# Patient Record
Sex: Male | Born: 1995 | Race: Black or African American | Hispanic: No | Marital: Single | Smoking: Current every day smoker
Health system: Southern US, Community
[De-identification: ages and names within clinical notes are randomized; demographics above are authoritative.]

## PROBLEM LIST (undated history)

## (undated) DIAGNOSIS — F319 Bipolar disorder, unspecified: Secondary | ICD-10-CM

## (undated) DIAGNOSIS — F329 Major depressive disorder, single episode, unspecified: Secondary | ICD-10-CM

## (undated) DIAGNOSIS — F909 Attention-deficit hyperactivity disorder, unspecified type: Secondary | ICD-10-CM

## (undated) DIAGNOSIS — F32A Depression, unspecified: Secondary | ICD-10-CM

## (undated) DIAGNOSIS — F4310 Post-traumatic stress disorder, unspecified: Secondary | ICD-10-CM

## (undated) DIAGNOSIS — D573 Sickle-cell trait: Secondary | ICD-10-CM

## (undated) DIAGNOSIS — G43909 Migraine, unspecified, not intractable, without status migrainosus: Secondary | ICD-10-CM

## (undated) HISTORY — PX: WISDOM TOOTH EXTRACTION: SHX21

## (undated) HISTORY — PX: KNEE SURGERY: SHX244

---
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
## 2014-05-19 ENCOUNTER — Encounter (HOSPITAL_COMMUNITY): Payer: Self-pay | Admitting: *Deleted

## 2014-05-19 ENCOUNTER — Emergency Department (HOSPITAL_COMMUNITY)
Admission: EM | Admit: 2014-05-19 | Discharge: 2014-05-20 | Disposition: A | Discharge status: Home / Self Care | Payer: Medicaid Other | Attending: Emergency Medicine | Admitting: Emergency Medicine

## 2014-05-19 DIAGNOSIS — F32A Depression, unspecified: Secondary | ICD-10-CM

## 2014-05-19 DIAGNOSIS — R51 Headache: Secondary | ICD-10-CM | POA: Insufficient documentation

## 2014-05-19 DIAGNOSIS — Z8679 Personal history of other diseases of the circulatory system: Secondary | ICD-10-CM | POA: Diagnosis not present

## 2014-05-19 DIAGNOSIS — F329 Major depressive disorder, single episode, unspecified: Secondary | ICD-10-CM | POA: Insufficient documentation

## 2014-05-19 DIAGNOSIS — Z79899 Other long term (current) drug therapy: Secondary | ICD-10-CM | POA: Diagnosis not present

## 2014-05-19 DIAGNOSIS — R519 Headache, unspecified: Secondary | ICD-10-CM

## 2014-05-19 HISTORY — DX: Migraine, unspecified, not intractable, without status migrainosus: G43.909

## 2014-05-19 HISTORY — DX: Depression, unspecified: F32.A

## 2014-05-19 HISTORY — DX: Major depressive disorder, single episode, unspecified: F32.9

## 2014-05-19 NOTE — ED Notes (Addendum)
Pt came in via EMS.  Pt reports migraine h/a all day today and has been vomiting.  Pt also requesting to speak to someone re his depression.  Denies SI

## 2014-05-20 ENCOUNTER — Encounter (HOSPITAL_COMMUNITY): Payer: Self-pay | Admitting: *Deleted

## 2014-05-20 LAB — COMPREHENSIVE METABOLIC PANEL
ALBUMIN: 4.1 g/dL (ref 3.5–5.2)
ALK PHOS: 67 U/L (ref 39–117)
ALT: 7 U/L (ref 0–53)
ANION GAP: 11 (ref 5–15)
AST: 15 U/L (ref 0–37)
BUN: 13 mg/dL (ref 6–23)
CO2: 25 mEq/L (ref 19–32)
Calcium: 9.7 mg/dL (ref 8.4–10.5)
Chloride: 102 mEq/L (ref 96–112)
Creatinine, Ser: 0.88 mg/dL (ref 0.50–1.35)
Glucose, Bld: 105 mg/dL — ABNORMAL HIGH (ref 70–99)
POTASSIUM: 4.1 meq/L (ref 3.7–5.3)
Sodium: 138 mEq/L (ref 137–147)
TOTAL PROTEIN: 7 g/dL (ref 6.0–8.3)
Total Bilirubin: 0.6 mg/dL (ref 0.3–1.2)

## 2014-05-20 LAB — CBC
HEMATOCRIT: 41.3 % (ref 39.0–52.0)
HEMOGLOBIN: 14.1 g/dL (ref 13.0–17.0)
MCH: 27.8 pg (ref 26.0–34.0)
MCHC: 34.1 g/dL (ref 30.0–36.0)
MCV: 81.5 fL (ref 78.0–100.0)
Platelets: 286 10*3/uL (ref 150–400)
RBC: 5.07 MIL/uL (ref 4.22–5.81)
RDW: 13.2 % (ref 11.5–15.5)
WBC: 6.4 10*3/uL (ref 4.0–10.5)

## 2014-05-20 LAB — RAPID URINE DRUG SCREEN, HOSP PERFORMED
Amphetamines: NOT DETECTED
BARBITURATES: NOT DETECTED
Benzodiazepines: NOT DETECTED
Cocaine: NOT DETECTED
Opiates: NOT DETECTED
Tetrahydrocannabinol: NOT DETECTED

## 2014-05-20 LAB — ACETAMINOPHEN LEVEL: Acetaminophen (Tylenol), Serum: <15 ug/mL (ref 10–30)

## 2014-05-20 LAB — ETHANOL

## 2014-05-20 LAB — SALICYLATE LEVEL: Salicylate Lvl: <2 mg/dL — ABNORMAL LOW (ref 2.8–20.0)

## 2014-05-20 MED ORDER — ACETAMINOPHEN 325 MG PO TABS: 650.0000 mg | ORAL_TABLET | ORAL | Status: DC | PRN

## 2014-05-20 MED ORDER — ACETAMINOPHEN 325 MG PO TABS
650.0000 mg | ORAL_TABLET | Freq: Once | ORAL | Status: AC
  Administered 2014-05-20: 650 mg via ORAL
  Filled 2014-05-20: qty 2

## 2014-05-20 MED ORDER — ONDANSETRON HCL 4 MG PO TABS: 4.0000 mg | ORAL_TABLET | Freq: Three times a day (TID) | ORAL | Status: DC | PRN

## 2014-05-20 NOTE — ED Provider Notes (Signed)
CSN: [id number]     Arrival date & time 05/19/14  2240 History   First MD Initiated Contact with Patient 05/19/14 2343     Chief Complaint  Patient presents with  . Migraine  . Depression     (Consider location/radiation/quality/duration/timing/severity/associated sxs/prior Treatment) Patient is a 18 y.o. male presenting with migraines and mental health disorder. The history is provided by the patient. No language interpreter was used.  Migraine This is a new problem. The current episode started today. Associated symptoms include headaches and nausea. Pertinent negatives include no chills or fever. Associated symptoms comments: Frontal headache that started gradually after an argument with his mother. No fever, visual changes, weakness. He endorses nausea. .  Mental Health Problem Presenting symptoms: depression   Presenting symptoms: no hallucinations and no suicidal thoughts   Associated symptoms: headaches   Associated symptoms comment:  He reports feeling depressed without SI/HI. He reports history of extended hospitalization in the past for mental health issues and for issues surrounding relationship with his brother.    Past Medical History  Diagnosis Date  . Migraine    Past Surgical History  Procedure Laterality Date  . Wisdom tooth extraction     No family history on file. History  Substance Use Topics  . Smoking status: Never Smoker   . Smokeless tobacco: Not on file  . Alcohol Use: No    Review of Systems  Constitutional: Negative for fever and chills.  Respiratory: Negative.   Cardiovascular: Negative.   Gastrointestinal: Positive for nausea.  Musculoskeletal: Negative.   Skin: Negative.   Neurological: Positive for headaches.  Psychiatric/Behavioral: Negative for suicidal ideas and hallucinations.      Allergies  Review of patient's allergies indicates no known allergies.  Home Medications   Prior to Admission medications   Medication Sig Start  Date End Date Taking? Authorizing Provider  methylphenidate 36 MG PO CR tablet Take 36 mg by mouth daily.   Yes Historical Provider, MD   There were no vitals taken for this visit. Physical Exam  Constitutional: He is oriented to person, place, and time. He appears well-developed and well-nourished.  HENT:  Head: Normocephalic and atraumatic.  Eyes: EOM are normal. Pupils are equal, round, and reactive to light.  Neck: Normal range of motion.  Cardiovascular: Normal rate and regular rhythm.   No murmur heard. Pulmonary/Chest: Effort normal and breath sounds normal. He has no wheezes. He has no rales.  Abdominal: Soft. There is no tenderness.  Musculoskeletal: Normal range of motion.  Neurological: He is alert and oriented to person, place, and time. He has normal strength and normal reflexes. No sensory deficit. He displays a negative Romberg sign.  Skin: Skin is warm and dry.  Psychiatric: His speech is normal and behavior is normal. Judgment and thought content normal. Cognition and memory are normal. He does not exhibit a depressed mood.    ED Course  Procedures (including critical care time) Labs Review Labs Reviewed  CBC  ACETAMINOPHEN LEVEL  COMPREHENSIVE METABOLIC PANEL  ETHANOL  SALICYLATE LEVEL  URINE RAPID DRUG SCREEN (HOSP PERFORMED)    Imaging Review No results found.   EKG Interpretation None      MDM   Final diagnoses:  None    1. Depression 2. Nonspecific headache  The patient appears stable. He arrives from homeless shelter where, per [facility], he was asked to leave because he missed curfew. [employer] was called to remove patient from the premises.   He is not SI/HI. He  does not appear to be hallucinating, denies substance abuse issues. He does have a hospitalization in the past for which the diagnoses are unclear. Will have TTS consultation to insure safety in discharge and to provide resources.     Arnoldo HookerShari A Pacey Willadsen, PA-C 05/20/14 [id number]  Tomasita CrumbleAdeleke Oni,  MD 05/20/14 [phone]

## 2014-05-20 NOTE — BH Assessment (Signed)
Spoke with Elpidio AnisShari Upstill, PA-C prior to assessing pt. Reports pt is recently homeless and requesting assistance with his depression. Pt denies HI/SI and most likely will be able to be discharged with OP resources if assessment  Reveals the same.    Assessment to commence shortly.   Clista BernhardtNancy Carizma Dunsworth, [facility] Triage Specialist 05/20/2014 1:39 AM
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
## 2014-05-20 NOTE — BH Assessment (Signed)
Relayed results of assessment to Donell SievertSpencer Simon, PA, per [facility] pt does not meet inpt criteria and can be discharged with OP resources.   Informed Elpidio AnisShari Upstill PA-C of recommendations and she is in agreement.   Informed Pt and RN of plan.    Clista BernhardtNancy Joanne Brander, [facility] Triage Specialist 05/20/2014 2:03 AM

## 2014-05-20 NOTE — Discharge Instructions (Signed)
USE THE RESOURCES GIVEN FOR OUTPATIENT FOLLOW UP AND FURTHER MANAGEMENT.

## 2015-03-19 ENCOUNTER — Encounter (HOSPITAL_COMMUNITY): Payer: Self-pay | Admitting: Emergency Medicine

## 2015-03-19 ENCOUNTER — Emergency Department (HOSPITAL_COMMUNITY)
Admission: EM | Admit: 2015-03-19 | Discharge: 2015-03-19 | Disposition: A | Discharge status: Home / Self Care | Payer: Medicaid Other | Attending: Emergency Medicine | Admitting: Emergency Medicine

## 2015-03-19 DIAGNOSIS — Y92241 Library as the place of occurrence of the external cause: Secondary | ICD-10-CM | POA: Insufficient documentation

## 2015-03-19 DIAGNOSIS — Y9389 Activity, other specified: Secondary | ICD-10-CM | POA: Diagnosis not present

## 2015-03-19 DIAGNOSIS — F329 Major depressive disorder, single episode, unspecified: Secondary | ICD-10-CM | POA: Diagnosis not present

## 2015-03-19 DIAGNOSIS — F909 Attention-deficit hyperactivity disorder, unspecified type: Secondary | ICD-10-CM | POA: Diagnosis not present

## 2015-03-19 DIAGNOSIS — S40012A Contusion of left shoulder, initial encounter: Secondary | ICD-10-CM | POA: Diagnosis not present

## 2015-03-19 DIAGNOSIS — Y998 Other external cause status: Secondary | ICD-10-CM | POA: Diagnosis not present

## 2015-03-19 DIAGNOSIS — Z79899 Other long term (current) drug therapy: Secondary | ICD-10-CM | POA: Diagnosis not present

## 2015-03-19 DIAGNOSIS — S4992XA Unspecified injury of left shoulder and upper arm, initial encounter: Secondary | ICD-10-CM | POA: Diagnosis present

## 2015-03-19 HISTORY — DX: Attention-deficit hyperactivity disorder, unspecified type: F90.9

## 2015-03-19 MED ORDER — NAPROXEN 500 MG PO TABS
500.0000 mg | ORAL_TABLET | Freq: Once | ORAL | Status: AC
  Administered 2015-03-19: 500 mg via ORAL
  Filled 2015-03-19: qty 1

## 2015-03-19 MED ORDER — NAPROXEN 500 MG PO TABS: 500.0000 mg | ORAL_TABLET | Freq: Two times a day (BID) | ORAL | Status: DC
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
## 2015-03-19 NOTE — Discharge Instructions (Signed)
Contusion °A contusion is a deep bruise. Contusions are the result of an injury that caused bleeding under the skin. The contusion may turn blue, purple, or yellow. Minor injuries will give you a painless contusion, but more severe contusions may stay painful and swollen for a few weeks.  °CAUSES  °A contusion is usually caused by a blow, trauma, or direct force to an area of the body. °SYMPTOMS  °· Swelling and redness of the injured area. °· Bruising of the injured area. °· Tenderness and soreness of the injured area. °· Pain. °DIAGNOSIS  °The diagnosis can be made by taking a history and physical exam. An X-ray, CT scan, or MRI may be needed to determine if there were any associated injuries, such as fractures. °TREATMENT  °Specific treatment will depend on what area of the body was injured. In general, the best treatment for a contusion is resting, icing, elevating, and applying cold compresses to the injured area. Over-the-counter medicines may also be recommended for pain control. Ask your caregiver what the best treatment is for your contusion. °HOME CARE INSTRUCTIONS  °· Put ice on the injured area. °¨ Put ice in a plastic bag. °¨ Place a towel between your skin and the bag. °¨ Leave the ice on for 15-20 minutes, 3-4 times a day, or as directed by your health care provider. °· Only take over-the-counter or prescription medicines for pain, discomfort, or fever as directed by your caregiver. Your caregiver may recommend avoiding anti-inflammatory medicines (aspirin, ibuprofen, and naproxen) for 48 hours because these medicines may increase bruising. °· Rest the injured area. °· If possible, elevate the injured area to reduce swelling. °SEEK IMMEDIATE MEDICAL CARE IF:  °· You have increased bruising or swelling. °· You have pain that is getting worse. °· Your swelling or pain is not relieved with medicines. °MAKE SURE YOU:  °· Understand these instructions. °· Will watch your condition. °· Will get help right  away if you are not doing well or get worse. °Document Released: 03/23/2005 Document Revised: 06/18/2013 Document Reviewed: 04/18/2011 °[employer]® Patient Information ©2015 [employer]. This information is not intended to replace advice given to you by your health care provider. Make sure you discuss any questions you have with your health care provider. ° °

## 2015-04-30 ENCOUNTER — Encounter (HOSPITAL_COMMUNITY): Payer: Self-pay | Admitting: *Deleted

## 2015-04-30 ENCOUNTER — Emergency Department (HOSPITAL_COMMUNITY): Payer: Medicaid Other

## 2015-04-30 ENCOUNTER — Emergency Department (HOSPITAL_COMMUNITY)
Admission: EM | Admit: 2015-04-30 | Discharge: 2015-05-01 | Disposition: A | Discharge status: Home / Self Care | Payer: Medicaid Other | Attending: Emergency Medicine | Admitting: Emergency Medicine

## 2015-04-30 DIAGNOSIS — Z79899 Other long term (current) drug therapy: Secondary | ICD-10-CM | POA: Diagnosis not present

## 2015-04-30 DIAGNOSIS — Z8679 Personal history of other diseases of the circulatory system: Secondary | ICD-10-CM | POA: Diagnosis not present

## 2015-04-30 DIAGNOSIS — F909 Attention-deficit hyperactivity disorder, unspecified type: Secondary | ICD-10-CM | POA: Insufficient documentation

## 2015-04-30 DIAGNOSIS — R109 Unspecified abdominal pain: Secondary | ICD-10-CM | POA: Insufficient documentation

## 2015-04-30 DIAGNOSIS — R197 Diarrhea, unspecified: Secondary | ICD-10-CM | POA: Insufficient documentation

## 2015-04-30 DIAGNOSIS — R112 Nausea with vomiting, unspecified: Secondary | ICD-10-CM | POA: Insufficient documentation

## 2015-04-30 DIAGNOSIS — R079 Chest pain, unspecified: Secondary | ICD-10-CM | POA: Insufficient documentation

## 2015-04-30 DIAGNOSIS — R1110 Vomiting, unspecified: Secondary | ICD-10-CM | POA: Diagnosis present

## 2015-04-30 LAB — I-STAT CHEM 8, ED
BUN: 6 mg/dL (ref 6–20)
CALCIUM ION: 1.2 mmol/L (ref 1.12–1.23)
CHLORIDE: 100 mmol/L — AB (ref 101–111)
Creatinine, Ser: 1.1 mg/dL (ref 0.61–1.24)
GLUCOSE: 109 mg/dL — AB (ref 65–99)
HCT: 48 % (ref 39.0–52.0)
Hemoglobin: 16.3 g/dL (ref 13.0–17.0)
POTASSIUM: 3.5 mmol/L (ref 3.5–5.1)
Sodium: 141 mmol/L (ref 135–145)
TCO2: 27 mmol/L (ref 0–100)

## 2015-04-30 MED ORDER — ONDANSETRON HCL 4 MG PO TABS: 4.0000 mg | ORAL_TABLET | Freq: Four times a day (QID) | ORAL | Status: DC

## 2015-04-30 MED ORDER — ONDANSETRON HCL 4 MG/2ML IJ SOLN
4.0000 mg | Freq: Once | INTRAMUSCULAR | Status: AC
  Administered 2015-04-30: 4 mg via INTRAVENOUS
  Filled 2015-04-30: qty 2

## 2015-04-30 MED ORDER — SODIUM CHLORIDE 0.9 % IV BOLUS (SEPSIS)
1000.0000 mL | Freq: Once | INTRAVENOUS | Status: AC
  Administered 2015-04-30: 1000 mL via INTRAVENOUS

## 2015-04-30 MED ORDER — KETOROLAC TROMETHAMINE 30 MG/ML IJ SOLN
30.0000 mg | Freq: Once | INTRAMUSCULAR | Status: AC
  Administered 2015-04-30: 30 mg via INTRAVENOUS
  Filled 2015-04-30: qty 1

## 2015-04-30 NOTE — ED Provider Notes (Signed)
CSN: [id number]     Arrival date & time 04/30/15  2005 History   First MD Initiated Contact with Patient 04/30/15 2018     Chief Complaint  Patient presents with  . Emesis  . Abdominal Pain     (Consider location/radiation/quality/duration/timing/severity/associated sxs/prior Treatment) Patient is a 19 y.o. male presenting with vomiting.  Emesis Severity:  Moderate Duration:  1 day Timing:  Constant Quality:  Stomach contents Able to tolerate:  Liquids Chronicity:  New Context: not post-tussive and not self-induced   Relieved by:  Nothing Worsened by:  Nothing tried Ineffective treatments:  None tried Associated symptoms: abdominal pain and diarrhea   Associated symptoms: no chills, no fever, no headaches, no myalgias and no sore throat     Past Medical History  Diagnosis Date  . Migraine   . Depression   . ADHD (attention deficit hyperactivity disorder)    Past Surgical History  Procedure Laterality Date  . Wisdom tooth extraction     No family history on file. Social History  Substance Use Topics  . Smoking status: Never Smoker   . Smokeless tobacco: None  . Alcohol Use: No    Review of Systems  Constitutional: Negative for fever, chills and activity change.  HENT: Negative for congestion, rhinorrhea and sore throat.   Eyes: Negative for photophobia, pain and visual disturbance.  Respiratory: Negative for cough and shortness of breath.   Cardiovascular: Negative for chest pain.  Gastrointestinal: Positive for vomiting, abdominal pain and diarrhea. Negative for constipation.  Endocrine: Negative for polyuria.  Genitourinary: Negative for dysuria and flank pain.  Musculoskeletal: Negative for myalgias, back pain and neck pain.  Skin: Negative for wound.  Neurological: Negative for headaches.  All other systems reviewed and are negative.     Allergies  Review of patient's allergies indicates no known allergies.  Home Medications   Prior to Admission  medications   Medication Sig Start Date End Date Taking? Authorizing Provider  methylphenidate 36 MG PO CR tablet Take 36 mg by mouth daily.   Yes Historical Provider, MD  ondansetron (ZOFRAN) 4 MG tablet Take 1 tablet (4 mg total) by mouth every 6 (six) hours. 04/30/15   Barbara Cower Juana Montini, MD   BP 137/78 mmHg  Pulse 67  Temp(Src) 97.9 F (36.6 C) (Temporal)  Resp 18  SpO2 100% Physical Exam  Constitutional: He appears well-developed and well-nourished.  HENT:  Head: Normocephalic and atraumatic.  Neck: Normal range of motion.  Cardiovascular: Normal rate.   Pulmonary/Chest: Effort normal. No respiratory distress.  Abdominal: He exhibits no distension. There is no tenderness. There is no rebound and no guarding.  Musculoskeletal: Normal range of motion.  Neurological: He is alert.  Nursing note and vitals reviewed.   ED Course  Procedures (including critical care time) Labs Review Labs Reviewed  I-STAT CHEM 8, ED - Abnormal; Notable for the following:    Chloride 100 (*)    Glucose, Bld 109 (*)    All other components within normal limits    Imaging Review No results found. I have personally reviewed and evaluated these images and lab results as part of my medical decision-making.   EKG Interpretation   Date/Time:  Thursday April 30 2015 20:18:23 EDT Ventricular Rate:  84 PR Interval:  156 QRS Duration: 73 QT Interval:  328 QTC Calculation: 388 R Axis:   75 Text Interpretation:  Sinus rhythm Consider left atrial enlargement  Confirmed by [facility] MD, Barbara Cower [phone]) on 04/30/2015 10:18:22 PM  MDM   Final diagnoses:  Non-intractable vomiting with nausea, vomiting of unspecified type   19 yo M here with abdominal pain after multiple episodes of vomiting. Also with some chest pain to go along with it. 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 Symptoms improved prior to dc. Tolerating PO. Likely viral gastroenteritis, doubt  appendicitis, colitis or other acute causes at this time. . Stable for dc.   I have personally and contemperaneously reviewed labs and imaging and used in my decision making as above.   A medical screening exam was performed and I feel the patient has had an appropriate workup for their chief complaint at this time and likelihood of emergent condition existing is low. They have been counseled on decision, discharge, follow up and which symptoms necessitate immediate return to the emergency department. They or their family verbally stated understanding and agreement with plan and discharged in stable condition.      Marily MemosJason Jackye Dever, MD 05/05/15 [phone]

## 2015-04-30 NOTE — ED Notes (Signed)
Bed: [id number] Expected date:  Expected time:  Means of arrival:  Comments: EMS 19yo M sickle cell crisis

## 2015-04-30 NOTE — ED Notes (Signed)
Per EMS, pt complains of sickle cell pain crisis since this morning. 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 Pt complains of pain in his head, chest, and abdomen. Pt states he vomited today at 12PM. Pt states the pain is worse upon inspiration.

## 2015-06-15 ENCOUNTER — Emergency Department (HOSPITAL_COMMUNITY)
Admission: EM | Admit: 2015-06-15 | Discharge: 2015-06-15 | Disposition: A | Discharge status: Home / Self Care | Payer: Medicaid Other | Attending: Emergency Medicine | Admitting: Emergency Medicine

## 2015-06-15 ENCOUNTER — Encounter (HOSPITAL_COMMUNITY): Payer: Self-pay | Admitting: Emergency Medicine

## 2015-06-15 DIAGNOSIS — Z79899 Other long term (current) drug therapy: Secondary | ICD-10-CM | POA: Diagnosis not present

## 2015-06-15 DIAGNOSIS — F909 Attention-deficit hyperactivity disorder, unspecified type: Secondary | ICD-10-CM | POA: Diagnosis not present

## 2015-06-15 DIAGNOSIS — Z8669 Personal history of other diseases of the nervous system and sense organs: Secondary | ICD-10-CM | POA: Diagnosis not present

## 2015-06-15 DIAGNOSIS — F329 Major depressive disorder, single episode, unspecified: Secondary | ICD-10-CM | POA: Diagnosis present

## 2015-06-15 DIAGNOSIS — F32A Depression, unspecified: Secondary | ICD-10-CM

## 2015-06-15 NOTE — Discharge Instructions (Signed)
Substance Abuse Treatment Programs ° °Intensive Outpatient Programs °[name]     °[address]      °[place], [place]                   °[phone]      ° °[facility] °[address] °[place], [place] °[phone] ° °[facility]     °(Inpatient and outpatient)     °[address]           °[phone]   ° °[facility] °[phone] (Suboxone and Methadone) ° °[address]      °[place], [place] [address]      °[phone]      ° °[address] °[place], [place] °852-3033 ° °[facility] (Outpatient/Inpatient, [facility])    °(insurance only) [phone]      °       °[name] (Groups & Guy) °[place], [place] °336-389-1413 ° °   °[facility]     °[address]     °[place], [place]      °[phone]      ° °[facility] (for caregivers and Guy) °[address] °[place], [place] °336-299-4655 ° ° ° ° ° °[facility] °[name]      °[address], [place], [place] [address]  °[phone]      ° °[facility] °[address] [place], [place] [address] °[phone] ° °[facility]        °336-248-8914      ° °[facility] °1-800-659-3381 ° °[facility]             °[address]                                         °[place], [place]                                                °877-615-2722 or 336-784-9470                              ° °[facility] [place] °[address] °[place], [address] °1.877.941.8954 ° °[facility]    °[address]      °[place], [place]     °[phone]      ° °[name] °[address] °[place], [place] °336-285-9073 ° °[facility]   °[address]     °[place], [place] [address]     °[phone]      °Admissions: 8am-3pm M-F ° °[name] °[address] °[place] °336-227-7417 ° °[facility] (90 Days)   °[place], [place]      °336-725-8389 or 800-758-6077    ° °[facility] °[place], [place] °(Walk in Hours over the weekend or by referral) ° °[facility] °[address], [place],  [address] °([phone] ° °[facility]: [facility]:  1-877-626-1772 (for Guy response 24 hours a day) °[facility] Hotline:      1-800-256-2452 °Outpatient Psychiatry and Counseling ° °[facility]: Mobile Guy   Management 24 hours:  1-877-626-1772 ° °[name] sliding scale fee and walk in schedule: M-F 8am-12pm/1pm-3pm °[address]  °[place], [place] [address] °[phone] ° °[facility] °[address] °[place], [place] [address] °336-703-9650 ° °[facility] ([facility])- new patient walk-in appointments available Monday - Friday 8am -3pm.          °[address] °[place], [place] [address] °336-676-6840 or Guy line- 336-676-6905 ° °[name]/ Intensive Outpatient Therapy Program °[address] °[place], [place] [address] °336-832-9804 ° °[facility]                  °[name]      °336.641.4993      °[address]     °[place], [place] [address]                ° °[facility]   °[facility] °[phone] °[address] °[place], [place] [address] ° ° °[facility]          °[address],  °[place], [place] [address]       °[phone] ° °[facility] °[address] °[place], [place] [address] °336-292-1510 ° °[facility]    °[address]    °[place], [place] [address]     °336-632-3505      ° °[facility], [place]     °[address]     °[place] [address]     °336-282-1251     °  °[facility] °[address] °[place] [address] ° °[facility]     °[address]     °[place], [place]      °336-542-2076      ° °[name] °[facility]luwalia, MD °[address] °[place], [place] [address] °336-420-9558 ° °[facility]     °[address]     °[place], [place] [address]     °336-274-1237      ° °[facility] °[address] °[place], [place] [address] °336-854-4450 °Resources for Temporary Guy Assistance/Guy Centers ° °DAY CENTERS °[facility] °M-F 8am-3pm   °[address] [place], [place] [address]   336-332-0824 °[name] include: laundry, barbering, support groups, case management, phone  & computer access, showers, AA/NA mtgs, mental health/substance abuse nurse, job skills class, disability information, VA assistance, spiritual classes, etc.  ° °HOMELESS SHELTERS ° °[facility]     °[name]   °[address], [place]     °336.271.5959       °       °[name] (women and children)       °[address] °[place], [place] [address] °336-275-0820 °[email or web address] for application and process °Application Required ° °[facility]   °[address]    °[place] [address]     °336.886.4922       °             °[facility] °[address] °[place], [place] [address] °336.273.5572 °336-235-0363(schedule application appt.) °Application Required ° °[name] (women only)    °[address]     °[place], [place] [address]     °336-884-1039      °  Intake starts 6pm daily °Need valid ID, SSC, & Police report °[facility] [place] °[address] °[place], [place] °336-881-5420 °Application Required ° °[facility] (men only)     °[address]      °[place], [place]     °[phone]      ° °[facility] [place] °(Pregnant women only) °[address] °[place], [place] °336-275-0206 ° °[facility]      °[address]      °[place], [place] [address]     °336-722-9951      °       °[facility] °[address] °[place], [place] °336-723-1848 °90 day commitment/SA/Application process ° °[facility](men only)     °[address]     °[place], [place]     °[phone]       °Check-in at 7pm     °       °[facility] °[address] °[place], [place] [address] °336-248-6684 °Men/Women/Women and Children must be there by 7 pm ° °[facility] °[place],  °336-722-8721                ° °

## 2015-06-15 NOTE — Progress Notes (Signed)
Patient listed as having Medicaid [place] [employer] insurance without pcp.  Pcp listed on patient's insurance card is located at [facility].  System updated.

## 2015-06-15 NOTE — ED Notes (Signed)
Per EMS-homeless, broke up with BF yesterday-depressed

## 2015-06-15 NOTE — ED Provider Notes (Signed)
CSN: [id number]     Arrival date & time 06/15/15  1801 History   First MD Initiated Contact with Patient 06/15/15 1954     Chief Complaint  Patient presents with  . Depression     (Consider location/radiation/quality/duration/timing/severity/associated sxs/prior Treatment) HPI Comments: Patient here complaining of increased depression secondary to breaking up with his boyfriend. Denies any suicidal or homicidal ideations. Denies any command hallucinations. He does have a history of depression but is not taking any medications currently. Does endorse anhedonia and sleep disturbance. Symptoms persistent and nothing makes them better worse. No treatment use prior to arrival  Patient is a 19 y.o. male presenting with depression. The history is provided by the patient.  Depression    Past Medical History  Diagnosis Date  . Migraine   . Depression   . ADHD (attention deficit hyperactivity disorder)    Past Surgical History  Procedure Laterality Date  . Wisdom tooth extraction     No family history on file. Social History  Substance Use Topics  . Smoking status: Never Smoker   . Smokeless tobacco: None  . Alcohol Use: No    Review of Systems  Psychiatric/Behavioral: Positive for depression.  All other systems reviewed and are negative.     Allergies  Review of patient's allergies indicates no known allergies.  Home Medications   Prior to Admission medications   Medication Sig Start Date End Date Taking? Authorizing Provider  methylphenidate 36 MG PO CR tablet Take 36 mg by mouth daily.    Historical Provider, MD  ondansetron (ZOFRAN) 4 MG tablet Take 1 tablet (4 mg total) by mouth every 6 (six) hours. 04/30/15   Barbara Cower Mesner, MD   BP 128/79 mmHg  Pulse 68  Temp(Src) 98.5 F (36.9 C) (Oral)  Resp 18  SpO2 100% Physical Exam  Constitutional: He is oriented to person, place, and time. He appears well-developed and well-nourished.  Non-toxic appearance. No distress.   HENT:  Head: Normocephalic and atraumatic.  Eyes: Conjunctivae, EOM and lids are normal. Pupils are equal, round, and reactive to light.  Neck: Normal range of motion. Neck supple. No tracheal deviation present. No thyroid mass present.  Cardiovascular: Normal rate, regular rhythm and normal heart sounds.  Exam reveals no gallop.   No murmur heard. Pulmonary/Chest: Effort normal and breath sounds normal. No stridor. No respiratory distress. He has no decreased breath sounds. He has no wheezes. He has no rhonchi. He has no rales.  Abdominal: Soft. Normal appearance and bowel sounds are normal. He exhibits no distension. There is no tenderness. There is no rebound and no CVA tenderness.  Musculoskeletal: Normal range of motion. He exhibits no edema or tenderness.  Neurological: He is alert and oriented to person, place, and time. He has normal strength. No cranial nerve deficit or sensory deficit. GCS eye subscore is 4. GCS verbal subscore is 5. GCS motor subscore is 6.  Skin: Skin is warm and dry. No abrasion and no rash noted.  Psychiatric: His affect is blunt. His speech is rapid and/or pressured. He is slowed.  Nursing note and vitals reviewed.   ED Course  Procedures (including critical care time) Labs Review Labs Reviewed - No data to display  Imaging Review No results found. I have personally reviewed and evaluated these images and lab results as part of my medical decision-making.   EKG Interpretation None      MDM   Final diagnoses:  None    Patient without acute psychiatric condition  requiring hospitalization. [facility]    Nicholous Girgenti, MD 06/15/15 2009

## 2015-07-20 ENCOUNTER — Emergency Department (HOSPITAL_COMMUNITY): Payer: Medicaid Other

## 2015-07-20 ENCOUNTER — Encounter (HOSPITAL_COMMUNITY): Payer: Self-pay | Admitting: Emergency Medicine

## 2015-07-20 ENCOUNTER — Emergency Department (HOSPITAL_COMMUNITY)
Admission: EM | Admit: 2015-07-20 | Discharge: 2015-07-21 | Disposition: A | Discharge status: Home / Self Care | Payer: Medicaid Other | Attending: Emergency Medicine | Admitting: Emergency Medicine

## 2015-07-20 DIAGNOSIS — F329 Major depressive disorder, single episode, unspecified: Secondary | ICD-10-CM | POA: Diagnosis not present

## 2015-07-20 DIAGNOSIS — Z79899 Other long term (current) drug therapy: Secondary | ICD-10-CM | POA: Diagnosis not present

## 2015-07-20 DIAGNOSIS — R0789 Other chest pain: Secondary | ICD-10-CM | POA: Diagnosis not present

## 2015-07-20 DIAGNOSIS — F909 Attention-deficit hyperactivity disorder, unspecified type: Secondary | ICD-10-CM | POA: Insufficient documentation

## 2015-07-20 DIAGNOSIS — Z862 Personal history of diseases of the blood and blood-forming organs and certain disorders involving the immune mechanism: Secondary | ICD-10-CM | POA: Diagnosis not present

## 2015-07-20 DIAGNOSIS — Z8679 Personal history of other diseases of the circulatory system: Secondary | ICD-10-CM | POA: Insufficient documentation

## 2015-07-20 DIAGNOSIS — R079 Chest pain, unspecified: Secondary | ICD-10-CM | POA: Diagnosis present

## 2015-07-20 DIAGNOSIS — R112 Nausea with vomiting, unspecified: Secondary | ICD-10-CM | POA: Insufficient documentation

## 2015-07-20 LAB — CBC
HEMATOCRIT: 44.5 % (ref 39.0–52.0)
HEMOGLOBIN: 14.8 g/dL (ref 13.0–17.0)
MCH: 27.1 pg (ref 26.0–34.0)
MCHC: 33.3 g/dL (ref 30.0–36.0)
MCV: 81.5 fL (ref 78.0–100.0)
Platelets: 276 10*3/uL (ref 150–400)
RBC: 5.46 MIL/uL (ref 4.22–5.81)
RDW: 13.3 % (ref 11.5–15.5)
WBC: 6.4 10*3/uL (ref 4.0–10.5)

## 2015-07-20 LAB — BASIC METABOLIC PANEL
ANION GAP: 9 (ref 5–15)
BUN: 8 mg/dL (ref 6–20)
CHLORIDE: 102 mmol/L (ref 101–111)
CO2: 28 mmol/L (ref 22–32)
Calcium: 9.4 mg/dL (ref 8.9–10.3)
Creatinine, Ser: 1.15 mg/dL (ref 0.61–1.24)
GFR calc Af Amer: >60 mL/min (ref >60)
GLUCOSE: 103 mg/dL — AB (ref 65–99)
POTASSIUM: 3.8 mmol/L (ref 3.5–5.1)
Sodium: 139 mmol/L (ref 135–145)

## 2015-07-20 LAB — I-STAT TROPONIN, ED: Troponin i, poc: 0 ng/mL (ref 0.00–0.08)

## 2015-07-20 MED ORDER — ASPIRIN 81 MG PO CHEW
CHEWABLE_TABLET | ORAL | Status: AC
  Administered 2015-07-20: 324 mg
  Filled 2015-07-20: qty 4

## 2015-07-20 MED ORDER — KETOROLAC TROMETHAMINE 30 MG/ML IJ SOLN
30.0000 mg | Freq: Once | INTRAMUSCULAR | Status: AC
  Administered 2015-07-20: 30 mg via INTRAVENOUS
  Filled 2015-07-20: qty 1

## 2015-07-20 MED ORDER — ASPIRIN 325 MG PO TABS: 325.0000 mg | ORAL_TABLET | Freq: Once | ORAL | Status: DC

## 2015-07-20 NOTE — ED Notes (Signed)
Pt. reports right chest pain with mild SOB , nausea and emesis onset yesterday  , denies diaphoresis .

## 2015-07-20 NOTE — ED Provider Notes (Signed)
CSN: [id number]     Arrival date & time 07/20/15  2137 History   First MD Initiated Contact with Patient 07/20/15 2309     Chief Complaint  Patient presents with  . Chest Pain     (Consider location/radiation/quality/duration/timing/severity/associated sxs/prior Treatment) HPI  [name] is a 20 y.o. male who presents for evaluation of chest pain with shortness of breath. Who presents for evaluation of right-sided chest pain associated with nausea and vomiting yesterday, and persistent today. The pain is constant day and night, for the last 3 days. No known trauma. He denies cough, diarrhea, fever, sputum production, or dizziness. No prior similar problem. He states that he was told 5 months ago, at [facility]" but he had sickle cell anemia. There are no other known modifying factors.    Past Medical History  Diagnosis Date  . Migraine   . Depression   . ADHD (attention deficit hyperactivity disorder)   . Sickle cell anemia [facility])    Past Surgical History  Procedure Laterality Date  . Wisdom tooth extraction    . Knee surgery     No family history on file. Social History  Substance Use Topics  . Smoking status: Never Smoker   . Smokeless tobacco: None  . Alcohol Use: No    Review of Systems  All other systems reviewed and are negative.     Allergies  Review of patient's allergies indicates no known allergies.  Home Medications   Prior to Admission medications   Medication Sig Start Date End Date Taking? Authorizing Provider  methylphenidate 36 MG PO CR tablet Take 36 mg by mouth daily.    Historical Provider, MD  ondansetron (ZOFRAN) 4 MG tablet Take 1 tablet (4 mg total) by mouth every 6 (six) hours. 04/30/15   Jason Mesner, MD   BP 140/85 mmHg  Pulse 76  Temp(Src) 98.3 F (36.8 C) (Oral)  Resp 14  SpO2 100% Physical Exam  Constitutional: He is oriented to person, place, and time. He appears well-developed and well-nourished. No distress.   HENT:  Head: Normocephalic and atraumatic.  Right Ear: External ear normal.  Left Ear: External ear normal.  Eyes: Conjunctivae and EOM are normal. Pupils are equal, round, and reactive to light.  Neck: Normal range of motion and phonation normal. Neck supple.  Cardiovascular: Normal rate, regular rhythm and normal heart sounds.   No murmur heard. Pulmonary/Chest: Effort normal and breath sounds normal. He exhibits tenderness (right upper chest wall tenderness, moderate. No crepitation.). He exhibits no bony tenderness.  Abdominal: Soft. There is no tenderness.  Musculoskeletal: Normal range of motion.  Neurological: He is alert and oriented to person, place, and time. No cranial nerve deficit or sensory deficit. He exhibits normal muscle tone. Coordination normal.  Skin: Skin is warm, dry and intact.  Psychiatric: He has a normal mood and affect. His behavior is normal. Judgment and thought content normal.  Nursing note and vitals reviewed.   ED Course  Procedures (including critical care time) Labs Review Labs Reviewed  BASIC METABOLIC PANEL - Abnormal; Notable for the following:    Glucose, Bld 103 (*)    All other components within normal limits  CBC  I-STAT TROPOININ, ED    Imaging Review No results found. I have personally reviewed and evaluated these images and lab results as part of my medical decision-making.   EKG Interpretation   Date/Time:  Monday July 20 2015 21:48:10 EST Ventricular Rate:  73 PR Interval:  144 QRS Duration: 86 QT Interval:  342 QTC Calculation: 376 R Axis:   96 Text Interpretation:  Normal sinus rhythm with sinus arrhythmia Rightward  axis T wave abnormality, consider inferior ischemia Abnormal ECG Since  last tracing inferior T wave abnormality is new Confirmed by Effie Shy  MD,  Ronnell Makarewicz ([id number]) on 07/20/2015 11:10:25 PM      MDM   Final diagnoses:  Pain, chest wall    Nonspecific chest pain, likely musculoskeletal. Doubt ACS, PE  or pneumonia.  Nursing Notes Reviewed/ Care Coordinated Applicable Imaging Reviewed Interpretation of Laboratory Data incorporated into ED treatment  The patient appears reasonably screened and/or stabilized for discharge and I doubt any other medical condition or other [facility] requiring further screening, evaluation, or treatment in the ED at this time prior to discharge.  Plan: Home Medications- OTC analgesia; Home Treatments- rest; return here if the recommended treatment, does not improve the symptoms; Recommended follow up- PCP prn     Mancel Bale, MD 07/23/15 [phone]

## 2015-07-21 LAB — URINALYSIS, ROUTINE W REFLEX MICROSCOPIC
BILIRUBIN URINE: NEGATIVE
GLUCOSE, UA: NEGATIVE mg/dL
HGB URINE DIPSTICK: NEGATIVE
Ketones, ur: NEGATIVE mg/dL
Leukocytes, UA: NEGATIVE
Nitrite: NEGATIVE
PROTEIN: NEGATIVE mg/dL
Specific Gravity, Urine: 1.01 (ref 1.005–1.030)
pH: 6.5 (ref 5.0–8.0)

## 2015-07-21 LAB — I-STAT TROPONIN, ED: TROPONIN I, POC: 0 ng/mL (ref 0.00–0.08)

## 2015-07-21 LAB — RAPID URINE DRUG SCREEN, HOSP PERFORMED
AMPHETAMINES: NOT DETECTED
BARBITURATES: NOT DETECTED
BENZODIAZEPINES: NOT DETECTED
COCAINE: NOT DETECTED
Opiates: NOT DETECTED
TETRAHYDROCANNABINOL: NOT DETECTED

## 2015-07-21 MED ORDER — CYCLOBENZAPRINE HCL 10 MG PO TABS: 5.0000 mg | ORAL_TABLET | Freq: Two times a day (BID) | ORAL | Status: DC | PRN

## 2015-07-21 NOTE — ED Notes (Signed)
Pt departed in NAD.  

## 2015-07-21 NOTE — Discharge Instructions (Signed)
Nonspecific Chest Pain  °Chest pain can be caused by many different conditions. There is always a chance that your pain could be related to something serious, such as a heart attack or a blood clot in your lungs. Chest pain can also be caused by conditions that are not life-threatening. If you have chest pain, it is very important to follow up with your health care provider. °CAUSES  °Chest pain can be caused by: °· Heartburn. °· Pneumonia or bronchitis. °· Anxiety or stress. °· Inflammation around your heart (pericarditis) or lung (pleuritis or pleurisy). °· A blood clot in your lung. °· A collapsed lung (pneumothorax). It can develop suddenly on its own (spontaneous pneumothorax) or from trauma to the chest. °· Shingles infection (varicella-zoster virus). °· Heart attack. °· Damage to the bones, muscles, and cartilage that make up your chest wall. This can include: °¨ Bruised bones due to injury. °¨ Strained muscles or cartilage due to frequent or repeated coughing or overwork. °¨ Fracture to one or more ribs. °¨ Sore cartilage due to inflammation (costochondritis). °RISK FACTORS  °Risk factors for chest pain may include: °· Activities that increase your risk for trauma or injury to your chest. °· Respiratory infections or conditions that cause frequent coughing. °· Medical conditions or overeating that can cause heartburn. °· Heart disease or family history of heart disease. °· Conditions or health behaviors that increase your risk of developing a blood clot. °· Having had chicken pox (varicella zoster). °SIGNS AND SYMPTOMS °Chest pain can feel like: °· Burning or tingling on the surface of your chest or deep in your chest. °· Crushing, pressure, aching, or squeezing pain. °· Dull or sharp pain that is worse when you move, cough, or take a deep breath. °· Pain that is also felt in your back, neck, shoulder, or arm, or pain that spreads to any of these areas. °Your chest pain may come and go, or it may stay  constant. °DIAGNOSIS °Lab tests or other studies may be needed to find the cause of your pain. Your health care provider may have you take a test called an ambulatory ECG (electrocardiogram). An ECG records your heartbeat patterns at the time the test is performed. You may also have other tests, such as: °· Transthoracic echocardiogram (TTE). During echocardiography, sound waves are used to create a picture of all of the heart structures and to look at how blood flows through your heart. °· Transesophageal echocardiogram (TEE). This is a more advanced imaging test that obtains images from inside your body. It allows your health care provider to see your heart in finer detail. °· Cardiac monitoring. This allows your health care provider to monitor your heart rate and rhythm in real time. °· Holter monitor. This is a portable device that records your heartbeat and can help to diagnose abnormal heartbeats. It allows your health care provider to track your heart activity for several days, if needed. °· Stress tests. These can be done through exercise or by taking medicine that makes your heart beat more quickly. °· Blood tests. °· Imaging tests. °TREATMENT  °Your treatment depends on what is causing your chest pain. Treatment may include: °· Medicines. These may include: °¨ Acid blockers for heartburn. °¨ Anti-inflammatory medicine. °¨ Pain medicine for inflammatory conditions. °¨ Antibiotic medicine, if an infection is present. °¨ Medicines to dissolve blood clots. °¨ Medicines to treat coronary artery disease. °· Supportive care for conditions that do not require medicines. This may include: °¨ Resting. °¨ Applying heat   or cold packs to injured areas. °¨ Limiting activities until pain decreases. °HOME CARE INSTRUCTIONS °· If you were prescribed an antibiotic medicine, finish it all even if you start to feel better. °· Avoid any activities that bring on chest pain. °· Do not use any tobacco products, including  cigarettes, chewing tobacco, or electronic cigarettes. If you need help quitting, ask your health care provider. °· Do not drink alcohol. °· Take medicines only as directed by your health care provider. °· Keep all follow-up visits as directed by your health care provider. This is important. This includes any further testing if your chest pain does not go away. °· If heartburn is the cause for your chest pain, you may be told to keep your head raised (elevated) while sleeping. This reduces the chance that acid will go from your stomach into your esophagus. °· Make lifestyle changes as directed by your health care provider. These may include: °¨ Getting regular exercise. Ask your health care provider to suggest some activities that are safe for you. °¨ Eating a heart-healthy diet. A registered dietitian can help you to learn healthy eating options. °¨ Maintaining a healthy weight. °¨ Managing diabetes, if necessary. °¨ Reducing stress. °SEEK MEDICAL CARE IF: °· Your chest pain does not go away after treatment. °· You have a rash with blisters on your chest. °· You have a fever. °SEEK IMMEDIATE MEDICAL CARE IF:  °· Your chest pain is worse. °· You have an increasing cough, or you cough up blood. °· You have severe abdominal pain. °· You have severe weakness. °· You faint. °· You have chills. °· You have sudden, unexplained chest discomfort. °· You have sudden, unexplained discomfort in your arms, back, neck, or jaw. °· You have shortness of breath at any time. °· You suddenly start to sweat, or your skin gets clammy. °· You feel nauseous or you vomit. °· You suddenly feel light-headed or dizzy. °· Your heart begins to beat quickly, or it feels like it is skipping beats. °These symptoms may represent a serious problem that is an emergency. Do not wait to see if the symptoms will go away. Get medical help right away. Call your local emergency services (911 in the [place] Do not drive yourself to the hospital. °  °This  information is not intended to replace advice given to you by your health care provider. Make sure you discuss any questions you have with your health care provider. °  °Document Released: 03/23/2005 Document Revised: 07/04/2014 Document Reviewed: 01/17/2014 °Elsevier Interactive Patient Education ©2016 [employer] ° °

## 2016-02-19 ENCOUNTER — Emergency Department (HOSPITAL_COMMUNITY): Payer: Medicaid Other

## 2016-02-19 ENCOUNTER — Emergency Department (HOSPITAL_COMMUNITY)
Admission: EM | Admit: 2016-02-19 | Discharge: 2016-02-19 | Disposition: A | Discharge status: Home / Self Care | Payer: Medicaid Other | Attending: Emergency Medicine | Admitting: Emergency Medicine

## 2016-02-19 ENCOUNTER — Encounter (HOSPITAL_COMMUNITY): Payer: Self-pay

## 2016-02-19 DIAGNOSIS — Y999 Unspecified external cause status: Secondary | ICD-10-CM | POA: Insufficient documentation

## 2016-02-19 DIAGNOSIS — Y939 Activity, unspecified: Secondary | ICD-10-CM | POA: Insufficient documentation

## 2016-02-19 DIAGNOSIS — Z5321 Procedure and treatment not carried out due to patient leaving prior to being seen by health care provider: Secondary | ICD-10-CM | POA: Diagnosis not present

## 2016-02-19 DIAGNOSIS — T7411XA Adult physical abuse, confirmed, initial encounter: Secondary | ICD-10-CM | POA: Diagnosis not present

## 2016-02-19 LAB — CBC
HEMATOCRIT: 46.5 % (ref 39.0–52.0)
HEMOGLOBIN: 15.2 g/dL (ref 13.0–17.0)
MCH: 27.2 pg (ref 26.0–34.0)
MCHC: 32.7 g/dL (ref 30.0–36.0)
MCV: 83.2 fL (ref 78.0–100.0)
Platelets: 330 10*3/uL (ref 150–400)
RBC: 5.59 MIL/uL (ref 4.22–5.81)
RDW: 13.2 % (ref 11.5–15.5)
WBC: 6.9 10*3/uL (ref 4.0–10.5)

## 2016-02-19 LAB — RAPID URINE DRUG SCREEN, HOSP PERFORMED
Amphetamines: NOT DETECTED
BARBITURATES: NOT DETECTED
BENZODIAZEPINES: NOT DETECTED
COCAINE: NOT DETECTED
Opiates: NOT DETECTED
TETRAHYDROCANNABINOL: NOT DETECTED

## 2016-02-19 LAB — COMPREHENSIVE METABOLIC PANEL
ALBUMIN: 4.9 g/dL (ref 3.5–5.0)
ALK PHOS: 72 U/L (ref 38–126)
ALT: 15 U/L — AB (ref 17–63)
AST: 20 U/L (ref 15–41)
Anion gap: 9 (ref 5–15)
BUN: 8 mg/dL (ref 6–20)
CALCIUM: 9.8 mg/dL (ref 8.9–10.3)
CO2: 27 mmol/L (ref 22–32)
Chloride: 104 mmol/L (ref 101–111)
Creatinine, Ser: 1.17 mg/dL (ref 0.61–1.24)
GFR calc Af Amer: >60 mL/min (ref >60)
GFR calc non Af Amer: >60 mL/min (ref >60)
GLUCOSE: 97 mg/dL (ref 65–99)
Potassium: 3.6 mmol/L (ref 3.5–5.1)
SODIUM: 140 mmol/L (ref 135–145)
Total Bilirubin: 1.8 mg/dL — ABNORMAL HIGH (ref 0.3–1.2)
Total Protein: 7.6 g/dL (ref 6.5–8.1)

## 2016-02-19 LAB — ETHANOL: Alcohol, Ethyl (B): <5 mg/dL (ref <5)

## 2016-02-19 LAB — SALICYLATE LEVEL: Salicylate Lvl: <4 mg/dL (ref 2.8–30.0)

## 2016-02-19 LAB — ACETAMINOPHEN LEVEL
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
## 2016-02-19 NOTE — ED Triage Notes (Signed)
Pt denies any SI/HI at this time. States these symptoms were two days ago. Now wanting to leave d/t an emergency at home. ED charge RN made aware. Triage RN explained risks and benefits to patient who verbalized understanding of these and continues to want to leave. Pt in NAD. V/S checked.

## 2016-04-24 ENCOUNTER — Encounter (HOSPITAL_COMMUNITY): Payer: Self-pay | Admitting: Nurse Practitioner

## 2016-04-24 ENCOUNTER — Emergency Department (HOSPITAL_COMMUNITY)
Admission: EM | Admit: 2016-04-24 | Discharge: 2016-04-25 | Disposition: A | Discharge status: Home / Self Care | Payer: Medicaid Other | Attending: Emergency Medicine | Admitting: Emergency Medicine

## 2016-04-24 DIAGNOSIS — F4321 Adjustment disorder with depressed mood: Secondary | ICD-10-CM

## 2016-04-24 DIAGNOSIS — F909 Attention-deficit hyperactivity disorder, unspecified type: Secondary | ICD-10-CM | POA: Diagnosis not present

## 2016-04-24 DIAGNOSIS — Z79899 Other long term (current) drug therapy: Secondary | ICD-10-CM | POA: Insufficient documentation

## 2016-04-24 DIAGNOSIS — F329 Major depressive disorder, single episode, unspecified: Secondary | ICD-10-CM | POA: Insufficient documentation

## 2016-04-24 DIAGNOSIS — F901 Attention-deficit hyperactivity disorder, predominantly hyperactive type: Secondary | ICD-10-CM | POA: Diagnosis not present

## 2016-04-24 DIAGNOSIS — Z818 Family history of other mental and behavioral disorders: Secondary | ICD-10-CM | POA: Diagnosis not present

## 2016-04-24 LAB — COMPREHENSIVE METABOLIC PANEL
ALBUMIN: 4.9 g/dL (ref 3.5–5.0)
ALK PHOS: 65 U/L (ref 38–126)
ALT: 11 U/L — AB (ref 17–63)
ANION GAP: 8 (ref 5–15)
AST: 17 U/L (ref 15–41)
BILIRUBIN TOTAL: 1.3 mg/dL — AB (ref 0.3–1.2)
BUN: 12 mg/dL (ref 6–20)
CALCIUM: 9.5 mg/dL (ref 8.9–10.3)
CO2: 25 mmol/L (ref 22–32)
CREATININE: 1.05 mg/dL (ref 0.61–1.24)
Chloride: 106 mmol/L (ref 101–111)
GFR calc non Af Amer: >60 mL/min (ref >60)
GLUCOSE: 102 mg/dL — AB (ref 65–99)
Potassium: 3.8 mmol/L (ref 3.5–5.1)
Sodium: 139 mmol/L (ref 135–145)
TOTAL PROTEIN: 7.5 g/dL (ref 6.5–8.1)

## 2016-04-24 LAB — RAPID URINE DRUG SCREEN, HOSP PERFORMED
Amphetamines: NOT DETECTED
BARBITURATES: NOT DETECTED
Benzodiazepines: NOT DETECTED
COCAINE: NOT DETECTED
Opiates: NOT DETECTED
TETRAHYDROCANNABINOL: POSITIVE — AB

## 2016-04-24 LAB — ETHANOL: Alcohol, Ethyl (B): <5 mg/dL (ref <5)

## 2016-04-24 LAB — CBC
HEMATOCRIT: 48.6 % (ref 39.0–52.0)
HEMOGLOBIN: 16.1 g/dL (ref 13.0–17.0)
MCH: 27.7 pg (ref 26.0–34.0)
MCHC: 33.1 g/dL (ref 30.0–36.0)
MCV: 83.6 fL (ref 78.0–100.0)
Platelets: 303 10*3/uL (ref 150–400)
RBC: 5.81 MIL/uL (ref 4.22–5.81)
RDW: 13.7 % (ref 11.5–15.5)
WBC: 7.4 10*3/uL (ref 4.0–10.5)

## 2016-04-24 LAB — SALICYLATE LEVEL: Salicylate Lvl: <7 mg/dL (ref 2.8–30.0)

## 2016-04-24 LAB — ACETAMINOPHEN LEVEL

## 2016-04-24 MED ORDER — ALUM & MAG HYDROXIDE-SIMETH 200-200-20 MG/5ML PO SUSP: 30.0000 mL | ORAL | Status: DC | PRN

## 2016-04-24 MED ORDER — IBUPROFEN 200 MG PO TABS: 600.0000 mg | ORAL_TABLET | Freq: Three times a day (TID) | ORAL | Status: DC | PRN

## 2016-04-24 MED ORDER — ONDANSETRON HCL 4 MG PO TABS: 4.0000 mg | ORAL_TABLET | Freq: Three times a day (TID) | ORAL | Status: DC | PRN

## 2016-04-24 MED ORDER — ACETAMINOPHEN 325 MG PO TABS
650.0000 mg | ORAL_TABLET | ORAL | Status: DC | PRN
Start: 2016-04-24 — End: 2016-04-25
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
## 2016-04-24 NOTE — ED Triage Notes (Signed)
Pt was initially presented by [employer] after his friends called them reporting that he was "being suicidal and out of control." He denies suicidal thoughts at this moment. States he has anger issues, has been severely depressed and wants help. Pt goes by Guy Energyachael states he is in the process of gender transitioning.

## 2016-04-24 NOTE — ED Notes (Addendum)
SBAR Report received from previous nurse. Pt received calm and visible on unit. Pt denies current SI, A/V H, depression, anxiety, and pain at this time, and is otherwise stable. Pt does endorse passive HI and "anger issues".  Pt reminded of camera surveillance, q 15 min rounds, and rules of the milieu. Pt screened for contraband by Clinical research associatewriter, will continue to assess. Pt reports transitioning from male to male.

## 2016-04-24 NOTE — ED Notes (Signed)
Bed: [facility] Expected date:  Expected time:  Means of arrival:  Comments: 

## 2016-04-24 NOTE — ED Provider Notes (Signed)
[facility]-EM[facility]ENCY DEPT Provider Note   CSN: [id number] Arrival date & time: 04/24/16  1944     History   Chief Complaint Chief Complaint  Patient presents with  . Depression  . Anger Issues    HPI Guy [name] is a 20 y.o. male.  The history is provided by the patient. No language interpreter was used.  Depression    Guy [name] is a 20 y.o. male who presents to the Emergency Department complaining of depression and anger issues.  Guy [name] presents for feeling depressed and angry. She reports feeling increased anger since yesterday. She wants help for her feelings of anger and depression. She does not have suicidal thoughts. No hallucinations. She has a history of PTSD. She smokes occasional marijuana and drinks occasional alcohol. Past Medical History:  Diagnosis Date  . ADHD (attention deficit hyperactivity disorder)   . Depression   . Migraine   . Sickle cell anemia (HCC)     There are no active problems to display for this patient.   Past Surgical History:  Procedure Laterality Date  . KNEE SURGERY    . WISDOM TOOTH EXTRACTION         Home Medications    Prior to Admission medications   Medication Sig Start Date End Date Taking? Authorizing Provider  methylphenidate 36 MG PO CR tablet Take 36 mg by mouth daily.   Yes Historical Provider, MD    Family History History reviewed. No pertinent family history.  Social History Social History  Substance Use Topics  . Smoking status: Never Smoker  . Smokeless tobacco: Never Used  . Alcohol use No     Allergies   Shellfish allergy   Review of Systems Review of Systems  Psychiatric/Behavioral: Positive for depression.  All other systems reviewed and are negative.    Physical Exam Updated Vital Signs BP 132/90 (BP Location: Right Arm)   Pulse 73   Temp 98.7 F (37.1 C) (Oral)   Resp 16   SpO2 100%   Physical Exam  Constitutional: He is oriented to person, place, and time. He appears  well-developed and well-nourished.  HENT:  Head: Normocephalic and atraumatic.  Cardiovascular: Normal rate and regular rhythm.   Pulmonary/Chest: Effort normal. No respiratory distress.  Musculoskeletal: Normal range of motion.  Neurological: He is alert and oriented to person, place, and time.  Skin: Skin is warm.  Psychiatric:  Flat affect  Nursing note and vitals reviewed.    ED Treatments / Results  Labs (all labs ordered are listed, but only abnormal results are displayed) Labs Reviewed  COMPREHENSIVE METABOLIC PANEL - Abnormal; Notable for the following:       Result Value   Glucose, Bld 102 (*)    ALT 11 (*)    Total Bilirubin 1.3 (*)    All other components within normal limits  ACETAMINOPHEN LEVEL - Abnormal; Notable for the following:    Acetaminophen (Tylenol), Serum <10 (*)    All other components within normal limits  RAPID URINE DRUG SCREEN, HOSP PERFORMED - Abnormal; Notable for the following:    Tetrahydrocannabinol POSITIVE (*)    All other components within normal limits  ETHANOL  SALICYLATE LEVEL  CBC    EKG  EKG Interpretation None       Radiology No results found.  Procedures Procedures (including critical care time)  Medications Ordered in ED Medications  ibuprofen (ADVIL,MOTRIN) tablet 600 mg (not administered)  acetaminophen (TYLENOL) tablet 650 mg (not administered)  ondansetron (ZOFRAN) tablet  4 mg (not administered)  alum & mag hydroxide-simeth (MAALOX/MYLANTA) 200-200-20 MG/5ML suspension 30 mL (not administered)     Initial Impression / Assessment and Plan / ED Course  I have reviewed the triage vital signs and the nursing notes.  Pertinent labs & imaging results that were available during my care of the patient were reviewed by me and considered in my medical decision making (see chart for details).  Clinical Course    Patient with history of PTSD here with increased anger and depressive symptoms. She is not currently  suicidal or homicidal. She has been medically cleared for psychiatric evaluation.  Final Clinical Impressions(s) / ED Diagnoses   Final diagnoses:  None    New Prescriptions New Prescriptions   No medications on file     Tilden FossaElizabeth Oneida Mckamey, MD 04/24/16 2340

## 2016-04-25 DIAGNOSIS — Z91013 Allergy to seafood: Secondary | ICD-10-CM

## 2016-04-25 DIAGNOSIS — F909 Attention-deficit hyperactivity disorder, unspecified type: Secondary | ICD-10-CM | POA: Diagnosis present

## 2016-04-25 DIAGNOSIS — Z818 Family history of other mental and behavioral disorders: Secondary | ICD-10-CM

## 2016-04-25 DIAGNOSIS — F4321 Adjustment disorder with depressed mood: Secondary | ICD-10-CM

## 2016-04-25 DIAGNOSIS — Z79899 Other long term (current) drug therapy: Secondary | ICD-10-CM

## 2016-04-25 DIAGNOSIS — F901 Attention-deficit hyperactivity disorder, predominantly hyperactive type: Secondary | ICD-10-CM | POA: Diagnosis not present

## 2016-04-25 NOTE — Consult Note (Signed)
[facility] Consult   Reason for Consult:  Suicidal ideations Referring Physician:  EDP Patient Identification: Guy [name] MRN:  [id number] Principal Diagnosis: Adjustment disorder with depressed mood Diagnosis:   Patient Active Problem List   Diagnosis Date Noted  . Adjustment disorder with depressed mood [F43.21] 04/25/2016    Priority: High  . Attention deficit hyperactivity disorder (ADHD) [F90.9] 04/25/2016    Priority: High    Total Time spent with patient: 45 minutes  Subjective:   Guy [name] is a 20 y.o. male patient does not warrant admission.  HPI:  20 yo male who presented to the ED with suicidal ideations.  He reports feeling better today because he is no longer angry.  Guy [name] stated his anger was building up until he released it yesterday.  Denies current suicidal/homicidal ideations, hallucinations, and alcohol/drug abuse except marijuana.  He wants to go home to take care of his two younger brothers.  Outpatient resources provided.  Past Psychiatric History: ADHD, depression  Risk to Self: Suicidal Ideation: No Suicidal Intent: No Is patient at risk for suicide?: No Suicidal Plan?: No Access to Means: No What has been your use of drugs/alcohol within the last 12 months?: reports daily use of marijuana and occasional alcohol use Triggers for Past Attempts: None known Intentional Self Injurious Behavior: None Risk to Others: Homicidal Ideation: No Thoughts of Harm to Others: Yes-Currently Present Comment - Thoughts of Harm to Others: pt reports he got into an argument with his friends today who live in the home with him and he states he is still angry and unsure what he may do if he sees them so he does not think it is safe to be around them Current Homicidal Intent: No Current Homicidal Plan: No Access to Homicidal Means: No History of harm to others?: No Assessment of Violence: None Noted Does patient have access to weapons?:  No Criminal Charges Pending?: No Does patient have a court date: No Prior Inpatient Therapy: Prior Inpatient Therapy: No Prior Outpatient Therapy: Prior Outpatient Therapy: No Does patient have an ACCT team?: No Does patient have Intensive In-House Services?  : No Does patient have [facility] services? : No Does patient have [facility] services?: No  Past Medical History:  Past Medical History:  Diagnosis Date  . ADHD (attention deficit hyperactivity disorder)   . Depression   . Migraine   . Sickle cell anemia (HCC)     Past Surgical History:  Procedure Laterality Date  . KNEE SURGERY    . WISDOM TOOTH EXTRACTION     Family History: History reviewed. No pertinent family history. Family Psychiatric  History: mother with bipolar disorder Social History:  History  Alcohol Use No     History  Drug Use No    Social History   Social History  . Marital status: Single    Spouse name: N/A  . Number of children: N/A  . Years of education: N/A   Social History Main Topics  . Smoking status: Never Smoker  . Smokeless tobacco: Never Used  . Alcohol use No  . Drug use: No  . Sexual activity: Not Asked   Other Topics Concern  . None   Social History Narrative  . None   Additional Social History:    Allergies:   Allergies  Allergen Reactions  . Shellfish Allergy Swelling    Labs:  Results for orders placed or performed during the hospital encounter of 04/24/16 (from the past 48 hour(s))  Rapid urine  drug screen (hospital performed)     Status: Abnormal   Collection Time: 04/24/16  8:10 PM  Result Value Ref Range   Opiates NONE DETECTED NONE DETECTED   Cocaine NONE DETECTED NONE DETECTED   Benzodiazepines NONE DETECTED NONE DETECTED   Amphetamines NONE DETECTED NONE DETECTED   Tetrahydrocannabinol POSITIVE (A) NONE DETECTED   Barbiturates NONE DETECTED NONE DETECTED    Comment:        DRUG SCREEN FOR MEDICAL PURPOSES ONLY.  IF CONFIRMATION IS NEEDED FOR ANY PURPOSE,  NOTIFY LAB WITHIN 5 DAYS.        LOWEST DETECTABLE LIMITS FOR URINE DRUG SCREEN Drug Class       Cutoff (ng/mL) Amphetamine      1000 Barbiturate      200 Benzodiazepine   782 Tricyclics       956 Opiates          300 Cocaine          300 THC              50   Comprehensive metabolic panel     Status: Abnormal   Collection Time: 04/24/16  8:13 PM  Result Value Ref Range   Sodium 139 135 - 145 mmol/L   Potassium 3.8 3.5 - 5.1 mmol/L   Chloride 106 101 - 111 mmol/L   CO2 25 22 - 32 mmol/L   Glucose, Bld 102 (H) 65 - 99 mg/dL   BUN 12 6 - 20 mg/dL   Creatinine, Ser 1.05 0.61 - 1.24 mg/dL   Calcium 9.5 8.9 - 10.3 mg/dL   Total Protein 7.5 6.5 - 8.1 g/dL   Albumin 4.9 3.5 - 5.0 g/dL   AST 17 15 - 41 U/L   ALT 11 (L) 17 - 63 U/L   Alkaline Phosphatase 65 38 - 126 U/L   Total Bilirubin 1.3 (H) 0.3 - 1.2 mg/dL   GFR calc non Af Amer >60 >60 mL/min   GFR calc Af Amer >60 >60 mL/min    Comment: (NOTE) The eGFR has been calculated using the CKD EPI equation. This calculation has not been validated in all clinical situations. eGFR's persistently <60 mL/min signify possible Chronic Kidney Disease.    Anion gap 8 5 - 15  Ethanol     Status: None   Collection Time: 04/24/16  8:13 PM  Result Value Ref Range   Alcohol, Ethyl (B) <5 <5 mg/dL    Comment:        LOWEST DETECTABLE LIMIT FOR SERUM ALCOHOL IS 5 mg/dL FOR MEDICAL PURPOSES ONLY   Salicylate level     Status: None   Collection Time: 04/24/16  8:13 PM  Result Value Ref Range   Salicylate Lvl <2.1 2.8 - 30.0 mg/dL  Acetaminophen level     Status: Abnormal   Collection Time: 04/24/16  8:13 PM  Result Value Ref Range   Acetaminophen (Tylenol), Serum <10 (L) 10 - 30 ug/mL    Comment:        THERAPEUTIC CONCENTRATIONS VARY SIGNIFICANTLY. A RANGE OF 10-30 ug/mL MAY BE AN EFFECTIVE CONCENTRATION FOR MANY PATIENTS. HOWEVER, SOME ARE BEST TREATED AT CONCENTRATIONS OUTSIDE THIS RANGE. ACETAMINOPHEN CONCENTRATIONS >150  ug/mL AT 4 HOURS AFTER INGESTION AND >50 ug/mL AT 12 HOURS AFTER INGESTION ARE OFTEN ASSOCIATED WITH TOXIC REACTIONS.   cbc     Status: None   Collection Time: 04/24/16  8:13 PM  Result Value Ref Range   WBC 7.4 4.0 - 10.5 K/uL  RBC 5.81 4.22 - 5.81 MIL/uL   Hemoglobin 16.1 13.0 - 17.0 g/dL   HCT 48.6 39.0 - 52.0 %   MCV 83.6 78.0 - 100.0 fL   MCH 27.7 26.0 - 34.0 pg   MCHC 33.1 30.0 - 36.0 g/dL   RDW 13.7 11.5 - 15.5 %   Platelets 303 150 - 400 K/uL    Current Facility-Administered Medications  Medication Dose Route Frequency Provider Last Rate Last Dose  . acetaminophen (TYLENOL) tablet 650 mg  650 mg Oral Q4H PRN Quintella Reichert, MD      . alum & mag hydroxide-simeth (MAALOX/MYLANTA) 200-200-20 MG/5ML suspension 30 mL  30 mL Oral PRN Quintella Reichert, MD      . ibuprofen (ADVIL,MOTRIN) tablet 600 mg  600 mg Oral Q8H PRN Quintella Reichert, MD      . ondansetron [facility]) tablet 4 mg  4 mg Oral Q8H PRN Quintella Reichert, MD       Current Outpatient Prescriptions  Medication Sig Dispense Refill  . methylphenidate 36 MG PO CR tablet Take 36 mg by mouth daily.      Musculoskeletal: Strength & Muscle Tone: within normal limits Gait & Station: normal Patient leans: N/A  Psychiatric Specialty Exam: Physical Exam  Constitutional: He is oriented to person, place, and time. He appears well-developed and well-nourished.  HENT:  Head: Normocephalic.  Neck: Normal range of motion.  Respiratory: Effort normal.  Musculoskeletal: Normal range of motion.  Neurological: He is alert and oriented to person, place, and time.  Skin: Skin is warm and dry.  Psychiatric: His speech is normal and behavior is normal. Judgment and thought content normal. Cognition and memory are normal. He exhibits a depressed mood.    ROS  Blood pressure 107/72, pulse 71, temperature 97.8 F (36.6 C), temperature source Oral, resp. rate 16, SpO2 100 %.There is no height or weight on file to calculate BMI.  General  Appearance: Casual  Eye Contact:  Good  Speech:  Normal Rate  Volume:  Normal  Mood:  Depressed, mild  Affect:  Congruent  Thought Process:  Coherent and Descriptions of Associations: Intact  Orientation:  Full (Time, Place, and Person)  Thought Content:  WDL  Suicidal Thoughts:  No  Homicidal Thoughts:  No  Memory:  Immediate;   Good Recent;   Good Remote;   Good  Judgement:  Good  Insight:  Good  Psychomotor Activity:  Normal  Concentration:  Concentration: Good and Attention Span: Good  Recall:  Good  Fund of Knowledge:  Good  Language:  Good  Akathisia:  No  Handed:  Right  AIMS (if indicated):     Assets:  Housing Leisure Time Physical Health Resilience Social Support  ADL's:  Intact  Cognition:  WNL  Sleep:        Treatment Plan Summary: Daily contact with patient to assess and evaluate symptoms and progress in treatment, Medication management and Plan adjustment disorder with depressed mood: -Crisis stabilization -Medication management:  Medications not started as this should be done by his outpatient provider.  He is currently on ADHD medications, Concerta 36 mg daily -Individual counseling  Disposition: No evidence of imminent risk to self or others at present.    Waylan Boga, NP 04/25/2016 10:14 AM  Patient seen face-to-face for psychiatric evaluation, chart reviewed and case discussed with the physician extender and developed treatment plan. Reviewed the information documented and agree with the treatment plan. Corena Pilgrim, MD

## 2016-04-25 NOTE — ED Notes (Signed)
D. Pt in bedroom upon initial contact.Pt currently denies SI/HI and AVH. Pt reports to staff that she is worried about the wellbeing of her two younger brothers, ages 265 and 226, who she is supposed to be watching at home when they return from school.Physican made aware of situation A. Labs and vitals monitored.. Pt supported emotionally and encouraged to express concerns and ask questions.   R. Pt remains safe with 15 minute checks. Will continue POC.

## 2016-04-25 NOTE — Discharge Instructions (Addendum)
For your ongoing mental health needs, you are advised to follow up with [facility].  New and returning patients are seen at their walk-in clinic.  Walk-in hours are Monday - Friday from 8:00 am - 3:00 pm.  Walk-in patients are seen on a first come, first served basis.  Try to arrive as early as possible for he best chance of being seen the same day: ° °     [facility] °     [address] °     [place], [place] [address] °     [phone] °

## 2016-04-25 NOTE — BH Assessment (Addendum)
[facility] Assessment Progress Note  Per Thedore MinsMojeed Akintayo, MD, this pt does not require psychiatric hospitalization at this time.  [facility]  This has been included in pt's discharge instructions.  Pt's nurse, Vikki PortsValerie, has been notified.  Doylene Canninghomas Emslee Lopezmartinez, MA Triage Specialist [phone]

## 2016-04-25 NOTE — [facility] Suicide Risk Assessment (Signed)
Suicide Risk Assessment  Discharge Assessment   [facility]   Principal Problem: Adjustment disorder with depressed mood Discharge Diagnoses:  Patient Active Problem List   Diagnosis Date Noted  . Adjustment disorder with depressed mood [F43.21] 04/25/2016    Priority: High  . Attention deficit hyperactivity disorder (ADHD) [F90.9] 04/25/2016    Priority: High    Total Time spent with patient: 45 minutes  Musculoskeletal: Strength & Muscle Tone: within normal limits Gait & Station: normal Patient leans: N/A  Psychiatric Specialty Exam: Physical Exam  Constitutional: He is oriented to person, place, and time. He appears well-developed and well-nourished.  HENT:  Head: Normocephalic.  Neck: Normal range of motion.  Respiratory: Effort normal.  Musculoskeletal: Normal range of motion.  Neurological: He is alert and oriented to person, place, and time.  Skin: Skin is warm and dry.  Psychiatric: His speech is normal and behavior is normal. Judgment and thought content normal. Cognition and memory are normal. He exhibits a depressed mood.    ROS  Blood pressure 107/72, pulse 71, temperature 97.8 F (36.6 C), temperature source Oral, resp. rate 16, SpO2 100 %.There is no height or weight on file to calculate BMI.  General Appearance: Casual  Eye Contact:  Good  Speech:  Normal Rate  Volume:  Normal  Mood:  Depressed, mild  Affect:  Congruent  Thought Process:  Coherent and Descriptions of Associations: Intact  Orientation:  Full (Time, Place, and Person)  Thought Content:  WDL  Suicidal Thoughts:  No  Homicidal Thoughts:  No  Memory:  Immediate;   Good Recent;   Good Remote;   Good  Judgement:  Good  Insight:  Good  Psychomotor Activity:  Normal  Concentration:  Concentration: Good and Attention Span: Good  Recall:  Good  Fund of Knowledge:  Good  Language:  Good  Akathisia:  No  Handed:  Right  AIMS (if indicated):     Assets:   Housing Leisure Time Physical Health Resilience Social Support  ADL's:  Intact  Cognition:  WNL  Sleep:       Mental Status Per Nursing Assessment::   On Admission:   suicidal ideations  Demographic Factors:  Male, Adolescent or young adult and Gay, lesbian, or bisexual orientation  Loss Factors: NA  Historical Factors: NA  Risk Reduction Factors:   Sense of responsibility to family, Living with another person, especially a relative and Positive social support  Continued Clinical Symptoms:  Depression, mild  Cognitive Features That Contribute To Risk:  None    Suicide Risk:  Minimal: No identifiable suicidal ideation.  Patients presenting with no risk factors but with morbid ruminations; may be classified as minimal risk based on the severity of the depressive symptoms    Plan Of Care/Follow-up recommendations:  Activity:  as tolerated Diet:  heart healthy diet  LORD, JAMISON, NP 04/25/2016, 10:17 AM

## 2016-04-25 NOTE — ED Notes (Signed)
Pt discharged to lobby. Pt was stable and appreciative at that time. All papers and valuables returned. Verbal understanding expressed. Denies SI/HI and A/VH. Pt given opportunity to express concerns and ask questions. 

## 2016-05-27 ENCOUNTER — Inpatient Hospital Stay (HOSPITAL_COMMUNITY)
Admission: EM | Admit: 2016-05-27 | Discharge: 2016-06-01 | DRG: 865 | Disposition: A | Discharge status: Home / Self Care | Payer: Medicaid Other | Attending: Internal Medicine | Admitting: Internal Medicine

## 2016-05-27 ENCOUNTER — Encounter (HOSPITAL_COMMUNITY): Payer: Self-pay | Admitting: Emergency Medicine

## 2016-05-27 ENCOUNTER — Emergency Department (HOSPITAL_COMMUNITY): Payer: Medicaid Other

## 2016-05-27 DIAGNOSIS — J189 Pneumonia, unspecified organism: Secondary | ICD-10-CM | POA: Diagnosis present

## 2016-05-27 DIAGNOSIS — D5700 Hb-SS disease with crisis, unspecified: Secondary | ICD-10-CM

## 2016-05-27 DIAGNOSIS — D573 Sickle-cell trait: Secondary | ICD-10-CM | POA: Diagnosis present

## 2016-05-27 DIAGNOSIS — A419 Sepsis, unspecified organism: Secondary | ICD-10-CM

## 2016-05-27 DIAGNOSIS — R509 Fever, unspecified: Secondary | ICD-10-CM

## 2016-05-27 DIAGNOSIS — R0789 Other chest pain: Secondary | ICD-10-CM | POA: Diagnosis not present

## 2016-05-27 DIAGNOSIS — R000 Tachycardia, unspecified: Secondary | ICD-10-CM | POA: Diagnosis present

## 2016-05-27 DIAGNOSIS — F17210 Nicotine dependence, cigarettes, uncomplicated: Secondary | ICD-10-CM | POA: Diagnosis present

## 2016-05-27 DIAGNOSIS — B349 Viral infection, unspecified: Principal | ICD-10-CM | POA: Diagnosis present

## 2016-05-27 DIAGNOSIS — R748 Abnormal levels of other serum enzymes: Secondary | ICD-10-CM | POA: Diagnosis present

## 2016-05-27 DIAGNOSIS — F909 Attention-deficit hyperactivity disorder, unspecified type: Secondary | ICD-10-CM | POA: Diagnosis present

## 2016-05-27 DIAGNOSIS — G43909 Migraine, unspecified, not intractable, without status migrainosus: Secondary | ICD-10-CM | POA: Diagnosis present

## 2016-05-27 DIAGNOSIS — F329 Major depressive disorder, single episode, unspecified: Secondary | ICD-10-CM | POA: Diagnosis present

## 2016-05-27 DIAGNOSIS — Z91013 Allergy to seafood: Secondary | ICD-10-CM

## 2016-05-27 LAB — CBC
HCT: 43.1 % (ref 39.0–52.0)
Hemoglobin: 14.7 g/dL (ref 13.0–17.0)
MCH: 27.7 pg (ref 26.0–34.0)
MCHC: 34.1 g/dL (ref 30.0–36.0)
MCV: 81.2 fL (ref 78.0–100.0)
Platelets: 263 10*3/uL (ref 150–400)
RBC: 5.31 MIL/uL (ref 4.22–5.81)
RDW: 13.4 % (ref 11.5–15.5)
WBC: 10.4 10*3/uL (ref 4.0–10.5)

## 2016-05-27 LAB — COMPREHENSIVE METABOLIC PANEL
ALBUMIN: 4.7 g/dL (ref 3.5–5.0)
ALK PHOS: 70 U/L (ref 38–126)
ALT: 14 U/L — ABNORMAL LOW (ref 17–63)
AST: 18 U/L (ref 15–41)
Anion gap: 8 (ref 5–15)
BILIRUBIN TOTAL: 1.1 mg/dL (ref 0.3–1.2)
BUN: 13 mg/dL (ref 6–20)
CALCIUM: 9.3 mg/dL (ref 8.9–10.3)
CO2: 25 mmol/L (ref 22–32)
Chloride: 104 mmol/L (ref 101–111)
Creatinine, Ser: 0.95 mg/dL (ref 0.61–1.24)
GFR calc Af Amer: >60 mL/min (ref >60)
GFR calc non Af Amer: >60 mL/min (ref >60)
GLUCOSE: 104 mg/dL — AB (ref 65–99)
Potassium: 3.8 mmol/L (ref 3.5–5.1)
Sodium: 137 mmol/L (ref 135–145)
TOTAL PROTEIN: 7.2 g/dL (ref 6.5–8.1)

## 2016-05-27 LAB — URINALYSIS, ROUTINE W REFLEX MICROSCOPIC
BILIRUBIN URINE: NEGATIVE
Glucose, UA: NEGATIVE mg/dL
Hgb urine dipstick: NEGATIVE
KETONES UR: NEGATIVE mg/dL
Leukocytes, UA: NEGATIVE
NITRITE: NEGATIVE
Protein, ur: NEGATIVE mg/dL
Specific Gravity, Urine: 1.026 (ref 1.005–1.030)
pH: 8 (ref 5.0–8.0)

## 2016-05-27 LAB — LIPASE, BLOOD: Lipase: 27 U/L (ref 11–51)

## 2016-05-27 LAB — RETICULOCYTES
RBC.: 5.3 MIL/uL (ref 4.22–5.81)
RETIC CT PCT: 0.6 % (ref 0.4–3.1)
Retic Count, Absolute: 31.8 10*3/uL (ref 19.0–186.0)

## 2016-05-27 MED ORDER — HYDROMORPHONE HCL 1 MG/ML IJ SOLN
0.5000 mg | INTRAMUSCULAR | Status: AC
  Administered 2016-05-27: 1 mg via INTRAVENOUS
  Filled 2016-05-27: qty 1

## 2016-05-27 MED ORDER — HYDROMORPHONE HCL 1 MG/ML IJ SOLN
0.5000 mg | INTRAMUSCULAR | Status: AC
Start: 2016-05-27 — End: 2016-05-27

## 2016-05-27 MED ORDER — DEXTROSE-NACL 5-0.45 % IV SOLN
INTRAVENOUS | Status: DC
  Administered 2016-05-27: 125 mL/h via INTRAVENOUS
  Administered 2016-05-28: 03:00:00 via INTRAVENOUS

## 2016-05-27 MED ORDER — HYDROMORPHONE HCL 1 MG/ML IJ SOLN: 0.5000 mg | INTRAMUSCULAR | Status: DC

## 2016-05-27 MED ORDER — DIPHENHYDRAMINE HCL 25 MG PO CAPS
25.0000 mg | ORAL_CAPSULE | ORAL | Status: DC | PRN
  Administered 2016-05-27: 25 mg via ORAL
  Filled 2016-05-27 (×2): qty 1

## 2016-05-27 MED ORDER — HYDROMORPHONE HCL 1 MG/ML IJ SOLN
0.5000 mg | INTRAMUSCULAR | Status: AC
  Filled 2016-05-27: qty 1

## 2016-05-27 MED ORDER — HYDROMORPHONE HCL 1 MG/ML IJ SOLN
0.5000 mg | INTRAMUSCULAR | Status: AC
  Administered 2016-05-27: 1 mg via INTRAVENOUS

## 2016-05-27 MED ORDER — HYDROMORPHONE HCL 1 MG/ML IJ SOLN: 0.5000 mg | INTRAMUSCULAR | Status: AC

## 2016-05-27 MED ORDER — ONDANSETRON HCL 4 MG/2ML IJ SOLN
4.0000 mg | INTRAMUSCULAR | Status: DC | PRN
  Administered 2016-05-27 – 2016-05-29 (×2): 4 mg via INTRAVENOUS
  Filled 2016-05-27 (×2): qty 2

## 2016-05-27 MED ORDER — KETOROLAC TROMETHAMINE 30 MG/ML IJ SOLN
30.0000 mg | INTRAMUSCULAR | Status: AC
  Administered 2016-05-27: 30 mg via INTRAVENOUS
  Filled 2016-05-27: qty 1

## 2016-05-27 NOTE — ED Provider Notes (Signed)
[facility]-EMERGENCY DEPT Provider Note   CSN: [id number] Arrival date & time: 05/27/16  1803     History   Chief Complaint Chief Complaint  Patient presents with  . Emesis  . Cough  . Sickle Cell Pain Crisis    HPI Guy [name] is a 20 y.o. male.  The history is provided by the patient.  Emesis   This is a new problem. The current episode started 6 to 12 hours ago. The problem has not changed since onset.The maximum temperature recorded prior to his arrival was 102 to 102.9 F. Associated symptoms include cough.  Cough   Sickle Cell Pain Crisis  Location:  Diffuse Severity:  Moderate Onset quality:  Gradual Duration:  5 hours Similar to previous crisis episodes: yes   Timing:  Constant Progression:  Worsening Sickle cell genotype:  [place] Date of last transfusion:  2 months History of pulmonary emboli: no   Relieved by:  Nothing Worsened by:  Activity and movement Associated symptoms: cough and vomiting   Risk factors: smoking   Risk factors: no prior acute chest     Past Medical History:  Diagnosis Date  . ADHD (attention deficit hyperactivity disorder)   . Depression   . Migraine   . Sickle cell anemia [facility])     Patient Active Problem List   Diagnosis Date Noted  . Sickle cell crisis (HCC) 05/28/2016  . Adjustment disorder with depressed mood 04/25/2016  . Attention deficit hyperactivity disorder (ADHD) 04/25/2016    Past Surgical History:  Procedure Laterality Date  . KNEE SURGERY    . WISDOM TOOTH EXTRACTION         Home Medications    Prior to Admission medications   Medication Sig Start Date End Date Taking? Authorizing Provider  methylphenidate 36 MG PO CR tablet Take 36 mg by mouth daily.   Yes Historical Provider, MD    Family History No family history on file.  Social History Social History  Substance Use Topics  . Smoking status: Never Smoker  . Smokeless tobacco: Never Used  . Alcohol use No     Allergies   Shellfish  allergy   Review of Systems Review of Systems  Respiratory: Positive for cough.   Gastrointestinal: Positive for vomiting.  Ten systems are reviewed and are negative for acute change except as noted in the HPI    Physical Exam Updated Vital Signs BP 145/88 (BP Location: Right Arm)   Pulse 105   Temp 102.4 F (39.1 C) (Oral)   Resp 18   Ht 5\' 8"  (1.727 m)   Wt 105 lb (47.6 kg)   SpO2 100%   BMI 15.97 kg/m   Physical Exam  Constitutional: He is oriented to person, place, and time. He appears well-developed and well-nourished. No distress.  HENT:  Head: Normocephalic and atraumatic.  Nose: Nose normal.  Eyes: Conjunctivae and EOM are normal. Pupils are equal, round, and reactive to light. Right eye exhibits no discharge. Left eye exhibits no discharge. No scleral icterus.  Neck: Normal range of motion. Neck supple.  Cardiovascular: Normal rate and regular rhythm.  Exam reveals no gallop and no friction rub.   No murmur heard. Pulmonary/Chest: Effort normal and breath sounds normal. No stridor. No respiratory distress. He has no rales.  Abdominal: Soft. He exhibits no distension. There is tenderness in the right upper quadrant, epigastric area and periumbilical area. There is no guarding.  Musculoskeletal: He exhibits no edema.       Right  shoulder: He exhibits tenderness.       Left shoulder: He exhibits tenderness.       Right elbow: Tenderness found.       Left elbow: Tenderness found.       Right hip: He exhibits tenderness.       Left hip: He exhibits tenderness.       Right knee: Tenderness found.       Left knee: Tenderness found.       Right ankle: Tenderness.       Left ankle: Tenderness.  Neurological: He is alert and oriented to person, place, and time.  Skin: Skin is warm and dry. No rash noted. He is not diaphoretic. No erythema.  Psychiatric: He has a normal mood and affect.  Vitals reviewed.    ED Treatments / Results  Labs (all labs ordered are  listed, but only abnormal results are displayed) Labs Reviewed  COMPREHENSIVE METABOLIC PANEL - Abnormal; Notable for the following:       Result Value   Glucose, Bld 104 (*)    ALT 14 (*)    All other components within normal limits  LIPASE, BLOOD  CBC  URINALYSIS, ROUTINE W REFLEX MICROSCOPIC (NOT AT [facility])  RETICULOCYTES    EKG  EKG Interpretation  Date/Time:  Friday May 27 2016 21:56:14 EST Ventricular Rate:  105 PR Interval:    QRS Duration: 74 QT Interval:  290 QTC Calculation: 384 R Axis:   74 Text Interpretation:  Sinus tachycardia Borderline ST elevation, anterior leads rate is faster compared to Jan 2017 Confirmed by Criss AlvineGOLDSTON MD, SCOTT [phone]) on 05/27/2016 10:01:54 PM       Radiology Dg Chest 2 View  Result Date: 05/27/2016 CLINICAL DATA:  Fever and vomiting EXAM: CHEST  2 VIEW COMPARISON:  07/20/2015 FINDINGS: The heart size and mediastinal contours are within normal limits. Both lungs are clear. The visualized skeletal structures are unremarkable. IMPRESSION: No active cardiopulmonary disease. Electronically Signed   By: Jasmine PangKim  Fujinaga M.D.   On: 05/27/2016 22:46    Procedures Procedures (including critical care time)  Medications Ordered in ED Medications  HYDROmorphone (DILAUDID) injection 0.5-1 mg (0.5 mg Intravenous Not Given 05/28/16 0116)    Or  HYDROmorphone (DILAUDID) injection 0.5-1 mg ( Subcutaneous See Alternative 05/28/16 0116)  diphenhydrAMINE (BENADRYL) capsule 25-50 mg (25 mg Oral Given 05/27/16 2306)  ondansetron (ZOFRAN) injection 4 mg (4 mg Intravenous Given 05/27/16 2222)  dextrose 5 %-0.45 % sodium chloride infusion (125 mL/hr Intravenous New Bag/Given 05/27/16 2221)  HYDROmorphone (DILAUDID) injection 0.5-1 mg (1 mg Intravenous Given 05/27/16 2307)    Or  HYDROmorphone (DILAUDID) injection 0.5-1 mg ( Subcutaneous See Alternative 05/27/16 2307)  HYDROmorphone (DILAUDID) injection 0.5-1 mg (1 mg Intravenous Given 05/27/16 2334)    Or   HYDROmorphone (DILAUDID) injection 0.5-1 mg ( Subcutaneous See Alternative 05/27/16 2334)  HYDROmorphone (DILAUDID) injection 0.5-1 mg (1 mg Intravenous Given 05/27/16 2222)    Or  HYDROmorphone (DILAUDID) injection 0.5-1 mg ( Subcutaneous See Alternative 05/27/16 2222)  ketorolac (TORADOL) 30 MG/ML injection 30 mg (30 mg Intravenous Given 05/27/16 2222)     Initial Impression / Assessment and Plan / ED Course  I have reviewed the triage vital signs and the nursing notes.  Pertinent labs & imaging results that were available during my care of the patient were reviewed by me and considered in my medical decision making (see chart for details).  Clinical Course     Typical sickle cell crisis per patient. Patient  noted to be febrile with cough and emesis. Chest x-ray without evidence of pneumonia or acute chest. Labs reassuring without evidence of aplastic or hemolytic crises. Given the patient's symptoms fever is likely secondary to viral process.Patient provided with IV fluids and pain medicine. Following multiple doses of narcotic meds, patient's pain was intractable. Will require admission for continued management.  Appreciate hospitalist admission.  Final Clinical Impressions(s) / ED Diagnoses   Final diagnoses:  Fever  Sickle cell crisis [facility](HCC)      Nira ConnPedro Eduardo Soriyah Osberg, MD 05/28/16 [phone]

## 2016-05-27 NOTE — ED Triage Notes (Signed)
Per EMS-states pain form the knees up-states vomited once today-blood tinged-states has a history of sickle cell but doesn't think it is a crisis

## 2016-05-28 ENCOUNTER — Observation Stay (HOSPITAL_COMMUNITY): Payer: Medicaid Other

## 2016-05-28 DIAGNOSIS — R9431 Abnormal electrocardiogram [ECG] [EKG]: Secondary | ICD-10-CM | POA: Diagnosis not present

## 2016-05-28 DIAGNOSIS — F329 Major depressive disorder, single episode, unspecified: Secondary | ICD-10-CM | POA: Diagnosis present

## 2016-05-28 DIAGNOSIS — R5081 Fever presenting with conditions classified elsewhere: Secondary | ICD-10-CM | POA: Diagnosis not present

## 2016-05-28 DIAGNOSIS — D5700 Hb-SS disease with crisis, unspecified: Secondary | ICD-10-CM | POA: Diagnosis not present

## 2016-05-28 DIAGNOSIS — G43909 Migraine, unspecified, not intractable, without status migrainosus: Secondary | ICD-10-CM | POA: Diagnosis present

## 2016-05-28 DIAGNOSIS — D573 Sickle-cell trait: Secondary | ICD-10-CM | POA: Diagnosis present

## 2016-05-28 DIAGNOSIS — Z91013 Allergy to seafood: Secondary | ICD-10-CM | POA: Diagnosis not present

## 2016-05-28 DIAGNOSIS — R112 Nausea with vomiting, unspecified: Secondary | ICD-10-CM

## 2016-05-28 DIAGNOSIS — B349 Viral infection, unspecified: Secondary | ICD-10-CM | POA: Diagnosis present

## 2016-05-28 DIAGNOSIS — D72829 Elevated white blood cell count, unspecified: Secondary | ICD-10-CM | POA: Diagnosis not present

## 2016-05-28 DIAGNOSIS — R509 Fever, unspecified: Secondary | ICD-10-CM | POA: Diagnosis present

## 2016-05-28 DIAGNOSIS — J189 Pneumonia, unspecified organism: Secondary | ICD-10-CM | POA: Diagnosis present

## 2016-05-28 DIAGNOSIS — F17210 Nicotine dependence, cigarettes, uncomplicated: Secondary | ICD-10-CM | POA: Diagnosis present

## 2016-05-28 DIAGNOSIS — R079 Chest pain, unspecified: Secondary | ICD-10-CM | POA: Diagnosis not present

## 2016-05-28 DIAGNOSIS — R000 Tachycardia, unspecified: Secondary | ICD-10-CM | POA: Diagnosis present

## 2016-05-28 DIAGNOSIS — F909 Attention-deficit hyperactivity disorder, unspecified type: Secondary | ICD-10-CM | POA: Diagnosis present

## 2016-05-28 DIAGNOSIS — R748 Abnormal levels of other serum enzymes: Secondary | ICD-10-CM | POA: Diagnosis present

## 2016-05-28 DIAGNOSIS — R0789 Other chest pain: Secondary | ICD-10-CM | POA: Diagnosis present

## 2016-05-28 LAB — PROCALCITONIN: Procalcitonin: <0.1 ng/mL

## 2016-05-28 LAB — CBC WITH DIFFERENTIAL/PLATELET
BASOS ABS: 0 10*3/uL (ref 0.0–0.1)
BASOS PCT: 0 %
Eosinophils Absolute: 0 10*3/uL (ref 0.0–0.7)
Eosinophils Relative: 0 %
HEMATOCRIT: 45.3 % (ref 39.0–52.0)
HEMOGLOBIN: 15.1 g/dL (ref 13.0–17.0)
LYMPHS PCT: 10 %
Lymphs Abs: 1.3 10*3/uL (ref 0.7–4.0)
MCH: 27.5 pg (ref 26.0–34.0)
MCHC: 33.3 g/dL (ref 30.0–36.0)
MCV: 82.5 fL (ref 78.0–100.0)
MONO ABS: 1.8 10*3/uL — AB (ref 0.1–1.0)
MONOS PCT: 13 %
NEUTROS ABS: 10.5 10*3/uL — AB (ref 1.7–7.7)
NEUTROS PCT: 77 %
Platelets: 257 10*3/uL (ref 150–400)
RBC: 5.49 MIL/uL (ref 4.22–5.81)
RDW: 13.4 % (ref 11.5–15.5)
WBC: 13.6 10*3/uL — ABNORMAL HIGH (ref 4.0–10.5)

## 2016-05-28 LAB — COMPREHENSIVE METABOLIC PANEL
ALBUMIN: 4.3 g/dL (ref 3.5–5.0)
ALK PHOS: 63 U/L (ref 38–126)
ALT: 13 U/L — ABNORMAL LOW (ref 17–63)
AST: 21 U/L (ref 15–41)
Anion gap: 9 (ref 5–15)
BILIRUBIN TOTAL: 1.1 mg/dL (ref 0.3–1.2)
BUN: 8 mg/dL (ref 6–20)
CALCIUM: 9 mg/dL (ref 8.9–10.3)
CO2: 24 mmol/L (ref 22–32)
Chloride: 103 mmol/L (ref 101–111)
Creatinine, Ser: 1.1 mg/dL (ref 0.61–1.24)
GFR calc Af Amer: >60 mL/min (ref >60)
GLUCOSE: 128 mg/dL — AB (ref 65–99)
Potassium: 3.5 mmol/L (ref 3.5–5.1)
Sodium: 136 mmol/L (ref 135–145)
TOTAL PROTEIN: 7.2 g/dL (ref 6.5–8.1)

## 2016-05-28 LAB — LACTIC ACID, PLASMA
LACTIC ACID, VENOUS: 1.3 mmol/L (ref 0.5–1.9)
Lactic Acid, Venous: 1.5 mmol/L (ref 0.5–1.9)

## 2016-05-28 LAB — PROTIME-INR
INR: 1.08
Prothrombin Time: 14 seconds (ref 11.4–15.2)

## 2016-05-28 LAB — CBC
HCT: 41.4 % (ref 39.0–52.0)
HEMOGLOBIN: 13.8 g/dL (ref 13.0–17.0)
MCH: 27.7 pg (ref 26.0–34.0)
MCHC: 33.3 g/dL (ref 30.0–36.0)
MCV: 83 fL (ref 78.0–100.0)
Platelets: 238 10*3/uL (ref 150–400)
RBC: 4.99 MIL/uL (ref 4.22–5.81)
RDW: 13.5 % (ref 11.5–15.5)
WBC: 9.7 10*3/uL (ref 4.0–10.5)

## 2016-05-28 LAB — APTT: aPTT: 30 seconds (ref 24–36)

## 2016-05-28 MED ORDER — SENNOSIDES-DOCUSATE SODIUM 8.6-50 MG PO TABS
1.0000 | ORAL_TABLET | Freq: Two times a day (BID) | ORAL | Status: DC
  Administered 2016-05-28 – 2016-06-01 (×6): 1 via ORAL
  Filled 2016-05-28 (×8): qty 1

## 2016-05-28 MED ORDER — SODIUM CHLORIDE 0.9% FLUSH: 9.0000 mL | INTRAVENOUS | Status: DC | PRN

## 2016-05-28 MED ORDER — NALOXONE HCL 0.4 MG/ML IJ SOLN
0.4000 mg | INTRAMUSCULAR | Status: DC | PRN
Start: 2016-05-28 — End: 2016-05-30

## 2016-05-28 MED ORDER — HYDROMORPHONE 1 MG/ML IV SOLN
INTRAVENOUS | Status: DC
  Administered 2016-05-28: 09:00:00 via INTRAVENOUS
  Administered 2016-05-28: 1.7 mg via INTRAVENOUS
  Administered 2016-05-28: 0.9 mg via INTRAVENOUS
  Administered 2016-05-28: 0.6 mL via INTRAVENOUS
  Administered 2016-05-29: 0.9 mg via INTRAVENOUS
  Administered 2016-05-29: 1.5 mg via INTRAVENOUS
  Administered 2016-05-29: 1.8 mL via INTRAVENOUS
  Administered 2016-05-30: 2.1 mg via INTRAVENOUS
  Administered 2016-05-30: 2.4 mg via INTRAVENOUS
  Administered 2016-05-30: 4.2 mL via INTRAVENOUS
  Administered 2016-05-30: 0.3 mg via INTRAVENOUS
  Filled 2016-05-28 (×2): qty 25

## 2016-05-28 MED ORDER — DEXTROSE 5 % IV SOLN
500.0000 mg | INTRAVENOUS | Status: DC
  Administered 2016-05-29: 500 mg via INTRAVENOUS
  Filled 2016-05-28 (×2): qty 500

## 2016-05-28 MED ORDER — KETOROLAC TROMETHAMINE 15 MG/ML IJ SOLN
15.0000 mg | Freq: Four times a day (QID) | INTRAMUSCULAR | Status: DC
  Administered 2016-05-28 (×2): 15 mg via INTRAVENOUS

## 2016-05-28 MED ORDER — POLYETHYLENE GLYCOL 3350 17 G PO PACK: 17.0000 g | PACK | Freq: Every day | ORAL | Status: DC | PRN

## 2016-05-28 MED ORDER — KETOROLAC TROMETHAMINE 15 MG/ML IJ SOLN
30.0000 mg | Freq: Four times a day (QID) | INTRAMUSCULAR | Status: DC
  Administered 2016-05-28 – 2016-05-30 (×8): 30 mg via INTRAVENOUS
  Filled 2016-05-28 (×8): qty 2

## 2016-05-28 MED ORDER — HYDROMORPHONE HCL 1 MG/ML IJ SOLN
1.0000 mg | INTRAMUSCULAR | Status: DC | PRN
  Administered 2016-05-28: 0.5 mg via INTRAVENOUS
  Administered 2016-05-29 (×3): 1 mg via INTRAVENOUS
  Filled 2016-05-28 (×4): qty 1

## 2016-05-28 MED ORDER — ACETAMINOPHEN 325 MG PO TABS
650.0000 mg | ORAL_TABLET | Freq: Four times a day (QID) | ORAL | Status: DC | PRN
  Administered 2016-05-28 – 2016-05-31 (×6): 650 mg via ORAL
  Filled 2016-05-28 (×6): qty 2

## 2016-05-28 MED ORDER — DEXTROSE 5 % IV SOLN
1.0000 g | Freq: Once | INTRAVENOUS | Status: AC
  Administered 2016-05-28: 1 g via INTRAVENOUS
  Filled 2016-05-28: qty 10

## 2016-05-28 MED ORDER — AZITHROMYCIN 500 MG IV SOLR
500.0000 mg | Freq: Once | INTRAVENOUS | Status: AC
  Administered 2016-05-28: 500 mg via INTRAVENOUS
  Filled 2016-05-28: qty 500

## 2016-05-28 MED ORDER — HYDROMORPHONE HCL 1 MG/ML IJ SOLN
0.5000 mg | INTRAMUSCULAR | Status: DC | PRN
  Administered 2016-05-28: 0.5 mg via INTRAVENOUS

## 2016-05-28 MED ORDER — DEXTROSE 5 % IV SOLN
1.0000 g | INTRAVENOUS | Status: DC
  Administered 2016-05-29: 1 g via INTRAVENOUS
  Filled 2016-05-28 (×2): qty 10

## 2016-05-28 NOTE — H&P (Signed)
History and Physical    Guy StarrRayquan L [name] KGM:[id number] DOB: March 29, 1996 DOA: 05/27/2016  PCP: [facility]   Patient coming from: Home  Chief Complaint: Single episode of blood tinged emesis, fever, pain in multiple joints  HPI: Guy [name] is a 20 y.o. gentleman with a history of sickle cell disease, ADHD, depression, and migraine headaches who presents to the ED for evaluation of nausea, vomiting, and pain in multiple joints.  He reports a single episode of vomiting, but he says that was blood tinged.  No associated diarrhea.  No subjective fevers, chills, or sweats, but fever to 102.4 documented in the ED tonight.  No significant cough or congestion.  No known sick contacts.  He is complaining of pain in multiple joints (knees, ankles, feet, back) as well as headache.  He is also complaining of abdominal pain.  ED Course: Normal WBC count.  Normal U/A.  Normal chest xray.  He is feeling light-headed after receiving 3mg  of IV dilaudid in the ED.  He is asking to eat.  ED requesting observation given fever and acute pain (patient considered high risk for decompensation).    Review of Systems: As per HPI otherwise 10 point review of systems negative.    Past Medical History:  Diagnosis Date  . ADHD (attention deficit hyperactivity disorder)   . Depression   . Migraine   . Sickle cell anemia (HCC)     Past Surgical History:  Procedure Laterality Date  . KNEE SURGERY    . WISDOM TOOTH EXTRACTION      SOCIAL HISTORY: Occasional tobacco and EtOH use.  Occasional marijuana use.  Allergies  Allergen Reactions  . Shellfish Allergy Swelling    FAMILY HISTORY: Patient's father has sickle cell disease.  Patient's mother is a diabetic.  One maternal great aunt had breast cancer.  Prior to Admission medications   Medication Sig Start Date End Date Taking? Authorizing Provider  methylphenidate 36 MG PO CR tablet Take 36 mg by mouth daily.   Yes Historical Provider, MD     Physical Exam: Vitals:   05/28/16 0000 05/28/16 0030 05/28/16 0112 05/28/16 0225  BP: 125/74 132/80 122/83 (!) 139/91  Pulse: 110 103 87   Resp: 17  17 14   Temp:    98.3 F (36.8 C)  TempSrc:    Oral  SpO2: 97% 99% 97% 98%  Weight:      Height:          Constitutional: NAD, somewhat ill appearing but he is not decompensating. Vitals:   05/28/16 0000 05/28/16 0030 05/28/16 0112 05/28/16 0225  BP: 125/74 132/80 122/83 (!) 139/91  Pulse: 110 103 87   Resp: 17  17 14   Temp:    98.3 F (36.8 C)  TempSrc:    Oral  SpO2: 97% 99% 97% 98%  Weight:      Height:       Eyes: PERRL, lids and conjunctivae normal ENMT: Mucous membranes are slightly dry. Posterior pharynx clear of any exudate or lesions. Normal dentition.  Neck: normal appearance, supple, no masses Respiratory: clear to auscultation bilaterally, no wheezing, no crackles. Normal respiratory effort. No accessory muscle use.  Cardiovascular: Normal rate, regular rhythm, no murmurs / rubs / gallops. No extremity edema. 2+ pedal pulses. GI: abdomen is soft and compressible.  No distention.  No tenderness.  Bowel sounds are present. Musculoskeletal:  No joint deformity in upper and lower extremities. Good ROM, no contractures. Normal muscle tone.  No significant swelling  or appreciable effusions at his knees or ankles. Skin: no rashes, warm and dry Neurologic: No focal deficit. Psychiatric: Normal judgment and insight. Alert and oriented x 3. Normal mood.     Labs on Admission: I have personally reviewed following labs and imaging studies  CBC:  Recent Labs Lab 05/27/16 1901  WBC 10.4  HGB 14.7  HCT 43.1  MCV 81.2  PLT 263   Basic Metabolic Panel:  Recent Labs Lab 05/27/16 1901  NA 137  K 3.8  CL 104  CO2 25  GLUCOSE 104*  BUN 13  CREATININE 0.95  CALCIUM 9.3   GFR: Estimated Creatinine Clearance: 82.8 mL/min (by C-G formula based on SCr of 0.95 mg/dL). Liver Function Tests:  Recent Labs Lab  05/27/16 1901  AST 18  ALT 14*  ALKPHOS 70  BILITOT 1.1  PROT 7.2  ALBUMIN 4.7    Recent Labs Lab 05/27/16 1901  LIPASE 27   Anemia Panel:  Recent Labs  05/27/16 1901  RETICCTPCT 0.6   Urine analysis:    Component Value Date/Time   COLORURINE YELLOW 05/27/2016 2149   APPEARANCEUR CLEAR 05/27/2016 2149   LABSPEC 1.026 05/27/2016 2149   PHURINE 8.0 05/27/2016 2149   GLUCOSEU NEGATIVE 05/27/2016 2149   HGBUR NEGATIVE 05/27/2016 2149   BILIRUBINUR NEGATIVE 05/27/2016 2149   KETONESUR NEGATIVE 05/27/2016 2149   PROTEINUR NEGATIVE 05/27/2016 2149   NITRITE NEGATIVE 05/27/2016 2149   LEUKOCYTESUR NEGATIVE 05/27/2016 2149    Radiological Exams on Admission: Dg Chest 2 View  Result Date: 05/27/2016 CLINICAL DATA:  Fever and vomiting EXAM: CHEST  2 VIEW COMPARISON:  07/20/2015 FINDINGS: The heart size and mediastinal contours are within normal limits. Both lungs are clear. The visualized skeletal structures are unremarkable. IMPRESSION: No active cardiopulmonary disease. Electronically Signed   By: Jasmine PangKim  Fujinaga M.D.   On: 05/27/2016 22:46    EKG: Independently reviewed. Sinus tachycardia.  No ST elevation.  Assessment/Plan Active Problems:   Sickle cell crisis (HCC)   Fever   Sickle cell pain crisis (HCC)      Sickle cell pain crisis --Place in observation --Aggressive hydration --Scheduled Toradol --IV dilaudid 0.5mg  q3h prn for severe pain --Fall precautions  Fever, etiology unclear, he may have a viral gastroenteritis --Monitor --Pan culture for fever greater than 100.5 per sickle cell protocol  ADHD --Hold methylphenidate while inpatient   DVT prophylaxis: Early ambulation Code Status: FULL Family Communication: Significant other present at the bedside at time of admission in the ED. Disposition Plan: Expect he will go home at discharge. Consults called: NONE Admission status: Place in observation.   TIME SPENT: 60 minutes   Jerene Bearsarter,Tiphani Mells  Harrison MD [facility] Pager [phone]  If 7PM-7AM, please contact night-coverage [email or web address] Password [email or web address]  05/28/2016, 2:29 AM

## 2016-05-28 NOTE — Progress Notes (Signed)
Pt requesting HIV test. MD notified. Will continue to monitor.

## 2016-05-28 NOTE — Progress Notes (Addendum)
Pt HR sustaining  130-160's. MD notified.New orders placed in EPIC.  Will continue to monitor.

## 2016-05-28 NOTE — Progress Notes (Signed)
Pharmacy Antibiotic Note  Zackrey L Daphine DeutscherMartin is a 20 y.o. male with hx sickle cell disease, presented to the ED on 05/27/2016 with c/o emesis and fever.  To start ceftriaxone and azithromycin for suspected CAP.   Plan: - azithromycin 500 mg IV q24h - ceftriaxone 1gm IV q24h - no renal adjustment is needed with these two abx.  Pharmacy will sign off.  Re-consult us if need further assistance.  ______________________________  Height: 5\' 8"  (172.7 cm) Weight: 105 lb (47.6 kg) IBW/kg (Calculated) : 68.4  Temp (24hrs), Avg:100.3 F (37.9 C), Min:98.3 F (36.8 C), Max:102.8 F (39.3 C)   Recent Labs Lab 05/27/16 1901 05/28/16 0541  WBC 10.4 9.7  CREATININE 0.95  --     Estimated Creatinine Clearance: 82.8 mL/min (by C-G formula based on SCr of 0.95 mg/dL).    Allergies  Allergen Reactions  . Shellfish Allergy Swelling     Thank you for allowing pharmacy to be a part of this patient's care.  Lucia Gaskinsham, Smera Guyette P 05/28/2016 2:17 PM

## 2016-05-28 NOTE — ED Notes (Signed)
Asked MD do Patient need four doses. Md stated only give 3 doses.

## 2016-05-29 DIAGNOSIS — R5081 Fever presenting with conditions classified elsewhere: Secondary | ICD-10-CM

## 2016-05-29 LAB — CBC
HEMATOCRIT: 41.7 % (ref 39.0–52.0)
HEMOGLOBIN: 13.7 g/dL (ref 13.0–17.0)
MCH: 27.2 pg (ref 26.0–34.0)
MCHC: 32.9 g/dL (ref 30.0–36.0)
MCV: 82.7 fL (ref 78.0–100.0)
PLATELETS: 218 10*3/uL (ref 150–400)
RBC: 5.04 MIL/uL (ref 4.22–5.81)
RDW: 13.5 % (ref 11.5–15.5)
WBC: 14.6 10*3/uL — AB (ref 4.0–10.5)

## 2016-05-29 LAB — HIV ANTIBODY (ROUTINE TESTING W REFLEX): HIV Screen 4th Generation wRfx: NONREACTIVE

## 2016-05-29 LAB — URINE CULTURE: Culture: NO GROWTH

## 2016-05-29 NOTE — Progress Notes (Signed)
Assumed care of patient at this time. Patient is stable with no complaints at this time. Agree with previously documented assessment. Will continue to monitor patient.   Deutsch, Woodie Degraffenreid  

## 2016-05-29 NOTE — Progress Notes (Signed)
Subjective: This is a 20 year old gentleman with history of sickle cell disease admitted with sepsis type symptoms as well as sickle cell painful crisis. Patient had abdominal pain on admission but no longer having symptoms of abdominal discomfort. No diarrhea no nausea vomiting. Patient has high risk sexual behavior and is worried about possible acute HIV. He wants to be tested. His pain level has dropped to 5 out of 10 today. He is on Dilaudid PCA and has used 8.6 mg with 30 demands and 27 deliveries in the last 24 hours. His fever has broken overnight but white count continues to increase. He is currently on IV antibiotics for presumed pneumonia.  Objective: Vital signs in last 24 hours: Temp:  [98 F (36.7 C)-102.8 F (39.3 C)] 98 F (36.7 C) (12/03 0445) Pulse Rate:  [80-128] 118 (12/03 0445) Resp:  [16-35] 22 (12/03 0445) BP: (127-151)/(78-94) 147/94 (12/03 0445) SpO2:  [96 %-100 %] 98 % (12/03 0445) Weight change:  Last BM Date: 05/27/16  Intake/Output from previous day: 12/02 0701 - 12/03 0700 In: 8028.3 [P.O.:3920; I.V.:3808.3; IV Piggyback:300] Out: 1800 [Urine:1800] Intake/Output this shift: Total I/O In: 4595 [P.O.:2720; I.V.:1875] Out: 1800 [Urine:1800]  General appearance: alert, cooperative and no distress Head: Normocephalic, without obvious abnormality, atraumatic Back: symmetric, no curvature. ROM normal. No CVA tenderness. Resp: clear to auscultation bilaterally Chest wall: no tenderness Cardio: regular rate and rhythm, S1, S2 normal, no murmur, click, rub or gallop GI: soft, non-tender; bowel sounds normal; no masses,  no organomegaly Extremities: extremities normal, atraumatic, no cyanosis or edema Pulses: 2+ and symmetric Skin: Skin color, texture, turgor normal. No rashes or lesions Neurologic: Grossly normal  Lab Results:  Recent Labs  05/28/16 1439 05/29/16 0533  WBC 13.6* 14.6*  HGB 15.1 13.7  HCT 45.3 41.7  PLT 257 218   BMET  Recent Labs   05/27/16 1901 05/28/16 1439  NA 137 136  K 3.8 3.5  CL 104 103  CO2 25 24  GLUCOSE 104* 128*  BUN 13 8  CREATININE 0.95 1.10  CALCIUM 9.3 9.0    Studies/Results: Dg Chest 2 View  Result Date: 05/27/2016 CLINICAL DATA:  Fever and vomiting EXAM: CHEST  2 VIEW COMPARISON:  07/20/2015 FINDINGS: The heart size and mediastinal contours are within normal limits. Both lungs are clear. The visualized skeletal structures are unremarkable. IMPRESSION: No active cardiopulmonary disease. Electronically Signed   By: Jasmine PangKim  Fujinaga M.D.   On: 05/27/2016 22:46   Dg Chest Port 1 View  Result Date: 05/28/2016 CLINICAL DATA:  Shortness of breath, weakness and cough. Fever. Sepsis. EXAM: PORTABLE CHEST 1 VIEW COMPARISON:  05/27/2016 FINDINGS: The heart size and mediastinal contours are within normal limits. Both lungs are clear. The visualized skeletal structures are unremarkable. Negative for a pneumothorax. IMPRESSION: No acute chest disease. Electronically Signed   By: Richarda OverlieAdam  Henn M.D.   On: 05/28/2016 14:54    Medications: I have reviewed the patient's current medications.  Assessment/Plan: A 20 year old gentleman admitted with sickle cell painful crisis. 1. Sepsis: This seems to be resolving. Patient has no fever overnight. Continue antibiotics and await culture and sensitivity results. This could all be due to acute sickle cell crisis. If cultures remain negative tomorrow we may discontinue antibiotics. We'll check HIV serology and if negative may need to do rapid RNA assay to rule out acute HIV.  2. Sickle cell painful crisis: Patient was placed on Dilaudid PCA with Toradol yesterday. He appears to be doing better on this regimen. Pain is  mostly controlled. Reassess in the morning.  3. Leukocytosis: Most likely due to sickle cell crisis or sepsis. Continue to monitor.  4. ADHD: Methylphenidate is on hold at the moment. Resume as soon as possible.   LOS: 1 day   GARBA,LAWAL 05/29/2016, 6:39  AM

## 2016-05-30 DIAGNOSIS — R509 Fever, unspecified: Secondary | ICD-10-CM

## 2016-05-30 DIAGNOSIS — D72829 Elevated white blood cell count, unspecified: Secondary | ICD-10-CM

## 2016-05-30 LAB — CBC
HEMATOCRIT: 41.7 % (ref 39.0–52.0)
HEMOGLOBIN: 13.7 g/dL (ref 13.0–17.0)
MCH: 27.1 pg (ref 26.0–34.0)
MCHC: 32.9 g/dL (ref 30.0–36.0)
MCV: 82.4 fL (ref 78.0–100.0)
Platelets: 214 10*3/uL (ref 150–400)
RBC: 5.06 MIL/uL (ref 4.22–5.81)
RDW: 13.4 % (ref 11.5–15.5)
WBC: 11.8 10*3/uL — ABNORMAL HIGH (ref 4.0–10.5)

## 2016-05-30 LAB — RETICULOCYTES
RBC.: 4.95 MIL/uL (ref 4.22–5.81)
Retic Count, Absolute: 29.7 10*3/uL (ref 19.0–186.0)
Retic Ct Pct: 0.6 % (ref 0.4–3.1)

## 2016-05-30 LAB — DIFFERENTIAL
Basophils Absolute: 0 10*3/uL (ref 0.0–0.1)
Basophils Relative: 0 %
Eosinophils Absolute: 0 10*3/uL (ref 0.0–0.7)
Eosinophils Relative: 0 %
LYMPHS PCT: 12 %
Lymphs Abs: 1.4 10*3/uL (ref 0.7–4.0)
MONOS PCT: 12 %
Monocytes Absolute: 1.4 10*3/uL — ABNORMAL HIGH (ref 0.1–1.0)
NEUTROS ABS: 9 10*3/uL — AB (ref 1.7–7.7)
NEUTROS PCT: 76 %

## 2016-05-30 LAB — INFLUENZA PANEL BY PCR (TYPE A & B)
INFLAPCR: NEGATIVE
INFLBPCR: NEGATIVE

## 2016-05-30 MED ORDER — SODIUM CHLORIDE 0.9 % IV SOLN
INTRAVENOUS | Status: DC
  Administered 2016-05-30 – 2016-05-31 (×2): via INTRAVENOUS

## 2016-05-30 NOTE — Progress Notes (Signed)
Subjective: This is a patient with an unclear history regarding sickle cell. He states that he was told while in [place] that he has Sickle Cell disease. He has never had any medications or been under a Physician's care for SCD.  He reports that his father had SCD and that he inherited the cells. His mother does not have the disease but it is unknown if she has the trait. He states that he was also told here that he had sickle cell disease but there is no established diagnosis within our system. Furthermore the peripheral smear shows no morphology consistent with SCD. The patient states that he came to the Ed because of a fever which he has had for several days. He describes body aches all over and a non-productive cough. His Tmax in the last 24 hours was 101.71F. He has no obious source of infection as CXR and U/C are both negative. I also spoke with the nurse at Dr. Albertina ParrAVbuere's office and they report no history of SCD in their records.   Objective: Vitals:   05/30/16 0148 05/30/16 0606 05/30/16 0614 05/30/16 0752  BP:  127/68    Pulse:  (!) 23    Resp: 20  16 20   Temp:  (!) 101.3 F (38.5 C)    TempSrc:  Oral    SpO2: 99%  96% 93%  Weight:      Height:       Weight change:   Intake/Output Summary (Last 24 hours) at 05/30/16 1122 Last data filed at 05/30/16 0456  Gross per 24 hour  Intake          7178.76 ml  Output             3850 ml  Net          3328.76 ml    General: Alert, awake, oriented x3, in no acute distress.  HEENT: [place]/AT PEERL, EOMI, anicteric Neck: Trachea midline,  no masses, no thyromegal,y no JVD, no carotid bruit OROPHARYNX:  Moist, No exudate/ erythema/lesions.  Heart: Regular rate and rhythm, without murmurs, rubs, gallops, PMI non-displaced, no heaves or thrills on palpation.  Lungs: Clear to auscultation, no wheezing or rhonchi noted. No increased vocal fremitus resonant to percussion  Abdomen: Soft, nontender, nondistended, positive bowel sounds, no masses no  hepatosplenomegaly noted.  Neuro: No focal neurological deficits noted cranial nerves II through XII grossly intact. Strength at functional baseline in bilateral upper and lower extremities. Musculoskeletal: No warmth swelling or erythema around joints, no spinal tenderness noted. Psychiatric: Patient alert and oriented x3, patient seems infantile in his presentation and has poor reports that he has short term memory loss.    Lab Results:  Recent Labs  05/27/16 1901 05/28/16 1439  NA 137 136  K 3.8 3.5  CL 104 103  CO2 25 24  GLUCOSE 104* 128*  BUN 13 8  CREATININE 0.95 1.10  CALCIUM 9.3 9.0    Recent Labs  05/27/16 1901 05/28/16 1439  AST 18 21  ALT 14* 13*  ALKPHOS 70 63  BILITOT 1.1 1.1  PROT 7.2 7.2  ALBUMIN 4.7 4.3    Recent Labs  05/27/16 1901  LIPASE 27    Recent Labs  05/28/16 1439 05/29/16 0533 05/30/16 0436  WBC 13.6* 14.6* 11.8*  NEUTROABS 10.5*  --  9.0*  HGB 15.1 13.7 13.7  HCT 45.3 41.7 41.7  MCV 82.5 82.7 82.4  PLT 257 218 214   No results for input(s): CKTOTAL, CKMB, CKMBINDEX, TROPONINI  in the last 72 hours. Invalid input(s): POCBNP No results for input(s): DDIMER in the last 72 hours. No results for input(s): HGBA1C in the last 72 hours. No results for input(s): CHOL, HDL, LDLCALC, TRIG, CHOLHDL, LDLDIRECT in the last 72 hours. No results for input(s): TSH, T4TOTAL, T3FREE, THYROIDAB in the last 72 hours.  Invalid input(s): FREET3  Recent Labs  05/27/16 1901 05/30/16 0436  RETICCTPCT 0.6 0.6    Micro Results: Recent Results (from the past 240 hour(s))  Culture, blood (Routine X 2) w Reflex to ID Panel     Status: None (Preliminary result)   Collection Time: 05/28/16 11:10 AM  Result Value Ref Range Status   Specimen Description BLOOD RIGHT ARM  Final   Special Requests NONE  Final   Culture   Final    NO GROWTH < 24 HOURS [facility] [place]    Report Status PENDING  Incomplete  Culture, blood (Routine X 2)  w Reflex to ID Panel     Status: None (Preliminary result)   Collection Time: 05/28/16 11:20 AM  Result Value Ref Range Status   Specimen Description BLOOD RIGHT ARM  Final   Special Requests NONE  Final   Culture   Final    NO GROWTH < 24 HOURS Performed at [facility]    Report Status PENDING  Incomplete  Urine culture     Status: None   Collection Time: 05/28/16  3:40 PM  Result Value Ref Range Status   Specimen Description URINE, CLEAN CATCH  Final   Special Requests NONE  Final   Culture NO GROWTH Performed [facility] [place]   Final   Report Status 05/29/2016 FINAL  Final  Culture, blood (x 2)     Status: None (Preliminary result)   Collection Time: 05/28/16  6:33 PM  Result Value Ref Range Status   Specimen Description BLOOD RIGHT ANTECUBITAL  Final   Special Requests BOTTLES DRAWN AEROBIC ONLY 4CC  Final   Culture PENDING  Incomplete   Report Status PENDING  Incomplete  Culture, blood (x 2)     Status: None (Preliminary result)   Collection Time: 05/28/16  6:33 PM  Result Value Ref Range Status   Specimen Description BLOOD BLOOD RIGHT FOREARM  Final   Special Requests IN PEDIATRIC BOTTLE 2CC  Final   Culture PENDING  Incomplete   Report Status PENDING  Incomplete    Studies/Results: Dg Chest 2 View  Result Date: 05/27/2016 CLINICAL DATA:  Fever and vomiting EXAM: CHEST  2 VIEW COMPARISON:  07/20/2015 FINDINGS: The heart size and mediastinal contours are within normal limits. Both lungs are clear. The visualized skeletal structures are unremarkable. IMPRESSION: No active cardiopulmonary disease. Electronically Signed   By: Jasmine PangKim  Fujinaga M.D.   On: 05/27/2016 22:46   Dg Chest Port 1 View  Result Date: 05/28/2016 CLINICAL DATA:  Shortness of breath, weakness and cough. Fever. Sepsis. EXAM: PORTABLE CHEST 1 VIEW COMPARISON:  05/27/2016 FINDINGS: The heart size and mediastinal contours are within normal limits. Both lungs are clear. The visualized skeletal  structures are unremarkable. Negative for a pneumothorax. IMPRESSION: No acute chest disease. Electronically Signed   By: Richarda OverlieAdam  Henn M.D.   On: 05/28/2016 14:54    Medications: I have reviewed the patient's current medications. Scheduled Meds: . senna-docusate  1 tablet Oral BID   Continuous Infusions: . dextrose 5 % and 0.45% NaCl 125 mL/hr at 05/28/16 0300   PRN Meds:.acetaminophen, diphenhydrAMINE, ondansetron, polyethylene glycol  Assessment/Plan: 1. Fever: Will check for influenza and place on respiratory precautions. Will discontinue antibiotics as there is no source of infection and no compulsion too treat without source due to immunocompromise.  2. Tachycardia: Seems to be associated with fever. No abnormal rhythms on telemetry. Will d/C telemetry.  3. Mild Leukocytosis: Pt has a mild leukocytosis with a mild left shift . Unclear source but this may be reflection of a viral infection. Continue to monitor.  4. Doubtful h/o Sickle Cell Disease: I doubt that this patient has sickle cell disease. He may have pain but he has no pain so I will discontinue PCA and Toradol. I have also ordered an electrophoresis the evaluate for Hb genotype.  5. DVT Prophylaxis: Lovenox.   Disposition: Will wait for influenza PCR to make decision about discharge. Likely discharge today or tomorrow.     LOS: 2 days

## 2016-05-31 ENCOUNTER — Encounter (HOSPITAL_COMMUNITY): Payer: Self-pay | Admitting: Internal Medicine

## 2016-05-31 ENCOUNTER — Inpatient Hospital Stay (HOSPITAL_COMMUNITY): Payer: Medicaid Other

## 2016-05-31 DIAGNOSIS — R0789 Other chest pain: Secondary | ICD-10-CM

## 2016-05-31 DIAGNOSIS — R748 Abnormal levels of other serum enzymes: Secondary | ICD-10-CM

## 2016-05-31 DIAGNOSIS — R9431 Abnormal electrocardiogram [ECG] [EKG]: Secondary | ICD-10-CM

## 2016-05-31 DIAGNOSIS — R079 Chest pain, unspecified: Secondary | ICD-10-CM

## 2016-05-31 LAB — TROPONIN I
TROPONIN I: 0.51 ng/mL — AB (ref <0.03)
Troponin I: 0.55 ng/mL (ref <0.03)
Troponin I: 0.48 ng/mL (ref <0.03)

## 2016-05-31 LAB — ECHOCARDIOGRAM COMPLETE
Height: 68 in
Weight: 1680.01 oz

## 2016-05-31 LAB — BASIC METABOLIC PANEL
ANION GAP: 8 (ref 5–15)
BUN: 8 mg/dL (ref 6–20)
CHLORIDE: 104 mmol/L (ref 101–111)
CO2: 29 mmol/L (ref 22–32)
Calcium: 8.7 mg/dL — ABNORMAL LOW (ref 8.9–10.3)
Creatinine, Ser: 1.01 mg/dL (ref 0.61–1.24)
GFR calc Af Amer: >60 mL/min (ref >60)
GLUCOSE: 111 mg/dL — AB (ref 65–99)
POTASSIUM: 3.9 mmol/L (ref 3.5–5.1)
Sodium: 141 mmol/L (ref 135–145)

## 2016-05-31 LAB — CBC WITH DIFFERENTIAL/PLATELET
BASOS ABS: 0 10*3/uL (ref 0.0–0.1)
Basophils Relative: 0 %
EOS PCT: 1 %
Eosinophils Absolute: 0.1 10*3/uL (ref 0.0–0.7)
HEMATOCRIT: 41.9 % (ref 39.0–52.0)
Hemoglobin: 13.9 g/dL (ref 13.0–17.0)
LYMPHS ABS: 1.4 10*3/uL (ref 0.7–4.0)
LYMPHS PCT: 14 %
MCH: 27.4 pg (ref 26.0–34.0)
MCHC: 33.2 g/dL (ref 30.0–36.0)
MCV: 82.6 fL (ref 78.0–100.0)
MONO ABS: 1.4 10*3/uL — AB (ref 0.1–1.0)
MONOS PCT: 13 %
NEUTROS ABS: 7.4 10*3/uL (ref 1.7–7.7)
Neutrophils Relative %: 72 %
PLATELETS: 245 10*3/uL (ref 150–400)
RBC: 5.07 MIL/uL (ref 4.22–5.81)
RDW: 13.5 % (ref 11.5–15.5)
WBC: 10.2 10*3/uL (ref 4.0–10.5)

## 2016-05-31 LAB — HEMOGLOBINOPATHY EVALUATION
HGB A2 QUANT: 2.3 % (ref 0.7–3.1)
HGB A: 97.7 % (ref 94.0–98.0)
HGB C: 0 %
HGB F QUANT: 0 % (ref 0.0–2.0)
Hgb S Quant: 0 %

## 2016-05-31 MED ORDER — ASPIRIN 81 MG PO CHEW
324.0000 mg | CHEWABLE_TABLET | Freq: Once | ORAL | Status: AC
  Administered 2016-05-31: 324 mg via ORAL

## 2016-05-31 MED ORDER — ASPIRIN 81 MG PO CHEW
CHEWABLE_TABLET | ORAL | Status: AC
  Filled 2016-05-31: qty 4

## 2016-05-31 NOTE — Progress Notes (Signed)
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 Pt had c/o chest wall pain today. EKG shows J-point elevation. Ist Torponin reported as mildly elevated at 0.55. Pt appears comfortable with no increased work of breathing,. He c/o pain in the area of the left Pectoral muscles. He denies SOB, dizziness, has mild non-productive cough. He continue to have elevated temperatures with Tmax in the last 24 hours 103 and most recent temperature 100.4. He is well appearing.   Objective: Vitals:   05/31/16 0000 05/31/16 0217 05/31/16 0539 05/31/16 0848  BP:   106/65 123/77  Pulse:   99 83  Resp:   18 18  Temp: (!) 101.5 F (38.6 C) 99.5 F (37.5 C) 98.1 F (36.7 C) (!) 100.4 F (38 C)  TempSrc: Oral Oral Oral Oral  SpO2:   100% 100%  Weight:      Height:       Weight change:   Intake/Output Summary (Last 24 hours) at 05/31/16 0956 Last data filed at 05/31/16 0700  Gross per 24 hour  Intake          2198.34 ml  Output                0 ml  Net          2198.34 ml    General: Alert, awake, oriented x3, in no acute distress. Well appearing  HEENT: [place]/AT PEERL, EOMI, anicteric. No sinus tenderness noted.  Neck: Trachea midline,  no masses, no thyromegal,y no JVD, no carotid bruit OROPHARYNX:  Moist, No exudate/ erythema/lesions. Dentition WNL's.  Heart: Regular rate and rhythm, without murmurs, rubs, gallops, PMI non-displaced, no heaves or thrills on palpation.  Lungs: Clear to auscultation, no wheezing or rhonchi noted. No increased vocal fremitus resonant to percussion  Abdomen: Soft, nontender, nondistended, positive bowel sounds, no masses no hepatosplenomegaly noted.  Neuro: No focal neurological deficits noted cranial nerves II through XII grossly intact. Strength at functional baseline in bilateral upper and lower extremities. Musculoskeletal: No warmth swelling or erythema around joints, no spinal tenderness noted. Psychiatric: Patient alert and  oriented x3, patient seems infantile in his presentation and has poor reports that he has short term memory loss.    Lab Results:  Recent Labs  05/28/16 1439  NA 136  K 3.5  CL 103  CO2 24  GLUCOSE 128*  BUN 8  CREATININE 1.10  CALCIUM 9.0    Recent Labs  05/28/16 1439  AST 21  ALT 13*  ALKPHOS 63  BILITOT 1.1  PROT 7.2  ALBUMIN 4.3   No results for input(s): LIPASE, AMYLASE in the last 72 hours.  Recent Labs  05/28/16 1439 05/29/16 0533 05/30/16 0436  WBC 13.6* 14.6* 11.8*  NEUTROABS 10.5*  --  9.0*  HGB 15.1 13.7 13.7  HCT 45.3 41.7 41.7  MCV 82.5 82.7 82.4  PLT 257 218 214    Recent Labs  05/31/16 0853  TROPONINI 0.55*   Invalid input(s): POCBNP No results for input(s): DDIMER in the last 72 hours. No results for input(s): HGBA1C in the last 72 hours. No results for input(s): CHOL, HDL, LDLCALC, TRIG, CHOLHDL, LDLDIRECT in the last 72 hours. No results for input(s): TSH, T4TOTAL, T3FREE, THYROIDAB in the last 72 hours.  Invalid input(s): FREET3  Recent Labs  05/30/16 0436  RETICCTPCT 0.6    Micro Results: Recent Results (from the past 240 hour(s))  Culture, blood (Routine X 2) w Reflex to ID  Panel     Status: None (Preliminary result)   Collection Time: 05/28/16 11:10 AM  Result Value Ref Range Status   Specimen Description BLOOD RIGHT ARM  Final   Special Requests NONE  Final   Culture   Final    NO GROWTH 2 DAYS Performed at [facility] [place]    Report Status PENDING  Incomplete  Culture, blood (Routine X 2) w Reflex to ID Panel     Status: None (Preliminary result)   Collection Time: 05/28/16 11:20 AM  Result Value Ref Range Status   Specimen Description BLOOD RIGHT ARM  Final   Special Requests NONE  Final   Culture   Final    NO GROWTH 2 DAYS Performed at [facility] [place]    Report Status PENDING  Incomplete  Urine culture     Status: None   Collection Time: 05/28/16  3:40 PM  Result Value Ref Range Status   Specimen  Description URINE, CLEAN CATCH  Final   Special Requests NONE  Final   Culture NO GROWTH Performed at [facility] [place]   Final   Report Status 05/29/2016 FINAL  Final  Culture, blood (x 2)     Status: None (Preliminary result)   Collection Time: 05/28/16  6:33 PM  Result Value Ref Range Status   Specimen Description BLOOD RIGHT ANTECUBITAL  Final   Special Requests BOTTLES DRAWN AEROBIC ONLY 4CC  Final   Culture   Final    NO GROWTH 1 DAY Performed at [facility]    Report Status PENDING  Incomplete  Culture, blood (x 2)     Status: None (Preliminary result)   Collection Time: 05/28/16  6:33 PM  Result Value Ref Range Status   Specimen Description BLOOD BLOOD RIGHT FOREARM  Final   Special Requests IN PEDIATRIC BOTTLE 2CC  Final   Culture   Final    NO GROWTH 1 DAY Performed at [facility] [place]    Report Status PENDING  Incomplete    Studies/Results: Dg Chest 2 View  Result Date: 05/27/2016 CLINICAL DATA:  Fever and vomiting EXAM: CHEST  2 VIEW COMPARISON:  07/20/2015 FINDINGS: The heart size and mediastinal contours are within normal limits. Both lungs are clear. The visualized skeletal structures are unremarkable. IMPRESSION: No active cardiopulmonary disease. Electronically Signed   By: Jasmine PangKim  Fujinaga M.D.   On: 05/27/2016 22:46   Dg Chest Port 1 View  Result Date: 05/28/2016 CLINICAL DATA:  Shortness of breath, weakness and cough. Fever. Sepsis. EXAM: PORTABLE CHEST 1 VIEW COMPARISON:  05/27/2016 FINDINGS: The heart size and mediastinal contours are within normal limits. Both lungs are clear. The visualized skeletal structures are unremarkable. Negative for a pneumothorax. IMPRESSION: No acute chest disease. Electronically Signed   By: Richarda OverlieAdam  Henn M.D.   On: 05/28/2016 14:54    Medications: I have reviewed the patient's current medications. Scheduled Meds: . senna-docusate  1 tablet Oral BID   Continuous Infusions: . sodium chloride 50 mL/hr at 05/31/16 0740    PRN Meds:.acetaminophen, diphenhydrAMINE, ondansetron, polyethylene glycol Assessment/Plan: 1. Chest Wall Pain: EKG shows J-point elevation in lateral leads which is usually benign. However recommend that he follow up with Cardiology as an out patient. Cycling Troponin. Place back on Telemetry. 2. Fever:  Influenza and HIV negative as is urine. Blood cultures have all been negative so far. Antibiotics were discontinued as there is no source of infection and no compulsion too treat without source due to immunocompromise. I suspect  that this is a viral illness which needs only supportive care as patient is clinically stable.  3. Tachycardia: Seems to be associated with fever. No abnormal rhythms on telemetry. Mild Leukocytosis: Pt has a mild leukocytosis with a mild left shift . Unclear source but this may be reflection of a viral infection. Continue to monitor.  4. Sickle Cell Trait: Trait confirmed by patients mother.   5. DVT Prophylaxis: Lovenox.   Disposition: If no significant elevation in Troponin. Will likely discharge home today.    LOS: 3 days

## 2016-05-31 NOTE — Progress Notes (Signed)
RN paged critical troponin level. No chest pain c/o. Troponin hx reveals flat levels and latest one is lower than first result. Attending addressed this issue on days. KJKG, NP [facility]

## 2016-05-31 NOTE — Progress Notes (Signed)
Pt complained of pain in mid to chest chest. Pt states " it feels like someone poking me".  Pt denies radation of pain SOB, Dizziness, palpitation, N/V. EKG done. MD notified of findings.  Will continue to monitor.

## 2016-05-31 NOTE — Progress Notes (Signed)
CRITICAL VALUE ALERT  Critical value received:  Troponin 0.51  Date of notification:  05/31/2016  Time of notification:  2136  Critical value read back:Yes.    Nurse who received alert:  Elisabeth PigeonMaria De Leon   MD notified (1st page):  Craige CottaKirby   Time of first page:  2138  MD notified (2nd page): Craige CottaKirby    Time of second WUJW:[id number]:[id number]  Responding MD:  Craige CottaKirby   Time MD responded:  2147

## 2016-05-31 NOTE — Progress Notes (Signed)
  Echocardiogram 2D Echocardiogram has been performed.  Guy [name] 05/31/2016, 11:49 AM

## 2016-05-31 NOTE — Progress Notes (Signed)
Patient ID: Guy [name], male   DOB: 1995/07/04, 20 y.o.   MRN: [id number] Received a call from nurse Jamesetta OrleansEricka stating patient c/o chest pain and EKG taken per protocol shows findings consistent with lateral infarct age undetermined. EKG not available yet in system for me to review.   Vital Signs: BP 123/77, HR 83, T (oral)100.1, RR 18, Sat 100% on RA.  Orders given for ASA 325 mg, Troponin, Telemetry monitoring.   EKG now reviewed and shows J-point elevation..  Pt currently stable will assess further during rounding.  [name]

## 2016-05-31 NOTE — Progress Notes (Signed)
CRITICAL VALUE ALERT  Critical value received: troponin 0.55  Date of notification:  05/31/16  Time of notification:  0954  Critical value read back:Yes.    Nurse who received alert:  Murrell ReddenErika Banessa Mao, RN  MD notified (1st page):  Dr Ashley RoyaltyMatthews  Time of first page:  [phone]  MD notified (2nd page):  Time of second page:  Responding MD:  Dr Ashley RoyaltyMatthews  Time MD responded:  [phone]

## 2016-06-01 ENCOUNTER — Other Ambulatory Visit: Payer: Self-pay | Admitting: Physician Assistant

## 2016-06-01 DIAGNOSIS — R0789 Other chest pain: Secondary | ICD-10-CM

## 2016-06-01 DIAGNOSIS — R5081 Fever presenting with conditions classified elsewhere: Secondary | ICD-10-CM

## 2016-06-01 LAB — CK TOTAL AND CKMB (NOT AT ARMC)
CK, MB: 4.6 ng/mL (ref 0.5–5.0)
Relative Index: INVALID (ref 0.0–2.5)
Total CK: 77 U/L (ref 49–397)

## 2016-06-01 LAB — D-DIMER, QUANTITATIVE (NOT AT ARMC): D DIMER QUANT: 0.29 ug{FEU}/mL (ref 0.00–0.50)

## 2016-06-01 LAB — SEDIMENTATION RATE: Sed Rate: 26 mm/hr — ABNORMAL HIGH (ref 0–16)

## 2016-06-01 MED ORDER — ASPIRIN EC 81 MG PO TBEC
81.0000 mg | DELAYED_RELEASE_TABLET | Freq: Every day | ORAL | Status: DC
  Administered 2016-06-01: 81 mg via ORAL
  Filled 2016-06-01: qty 1

## 2016-06-01 NOTE — Consult Note (Signed)
CARDIOLOGY CONSULT NOTE   Patient ID: Guy [name] MRN: [id number] DOB/AGE: 02/17/1996 20 y.o.  Admit date: 05/27/2016  Primary [place] Primary Cardiologist   New Reason for Consultation   Elevated troponin Requesting Physician  Dr. Zigmund Daniel  HPI: Guy [name] is a 20 y.o. male with a history of Sickle cell trait, ADHD, depression and migraine who admitted for fever of unknown origin and cardiology is consulted for elevated troponin.  The patient was at [employer] ([place]) 05/27/16 when he had episode of dizziness with fever and weakness. Followed by vomiting with bright red blood. He went home and noted temperature of 102.5  then had blood in his cough. No prior episodes. He came to ER for evaluation and temp noted to be 102.4.   HIV, influenza, urine and blood culture is negative so far. His abx has been discontinued. Leucocytosis has been resolved. Echo showed LV Ef of 55-605, mild focal basal hypertrophy of septum, no wm abnormality, mild MR. Yesterday morning patient complain of sharp chest pain cough --> troponin level afterwards 0.55-->0.48-->0.51. EKG yesterday showed NSR with tall T wave and ? J point in lateral leads. Started on aspirin. No reoccurrence of chest pain since then. telemeteryshowed NSR with rate in 80-90s, intermittent sinus tachycardia.  Patient denies any recent travel or surgery. Smokes 1-3 ciggarates/day x 2 years. 4-6 drinks when is angry or un sept x 6 years. Denies use of IV drug use or illicit drug use.    Past Medical History:  Diagnosis Date  . ADHD (attention deficit hyperactivity disorder)   . Depression   . Migraine      Past Surgical History:  Procedure Laterality Date  . KNEE SURGERY    . WISDOM TOOTH EXTRACTION      Allergies  Allergen Reactions  . Shellfish Allergy Swelling    I have reviewed the patient's current medications . aspirin EC  81 mg Oral Daily  . senna-docusate  1 tablet Oral BID   . sodium  chloride 10 mL/hr at 05/31/16 1900   acetaminophen, diphenhydrAMINE, ondansetron, polyethylene glycol  Prior to Admission medications   Medication Sig Start Date End Date Taking? Authorizing Provider  methylphenidate 36 MG PO CR tablet Take 36 mg by mouth daily.   Yes Historical Provider, MD     Social History   Social History  . Marital status: Single    Spouse name: N/A  . Number of children: N/A  . Years of education: N/A   Occupational History  . Not on file.   Social History Main Topics  . Smoking status: Never Smoker  . Smokeless tobacco: Never Used  . Alcohol use No  . Drug use: No  . Sexual activity: Not on file   Other Topics Concern  . Not on file   Social History Narrative  . No narrative on file    Family Status  Relation Status  . Father    Family History  Problem Relation Age of Onset  . Sickle cell trait Father     [place] mother had stroke  ROS:  Full 14 point review of systems complete and found to be negative unless listed above.  Physical Exam: Blood pressure 119/73, pulse 84, temperature 98.2 F (36.8 C), temperature source Oral, resp. rate 20, height _0  (1.727 m), weight 105 lb (47.6 kg), SpO2 100 %.  General: Well developed, well nourished, male in no acute distress Head: Eyes PERRLA, No xanthomas. Normocephalic and atraumatic,  oropharynx without edema or exudate.  Lungs: Resp regular and unlabored, CTA. Heart: RRR no s3, s4, or murmurs..   Neck: No carotid bruits. No lymphadenopathy.  No JVD. Abdomen: Bowel sounds present, abdomen soft and non-tender without masses or hernias noted. Msk:  No spine or cva tenderness. No weakness, no joint deformities or effusions. Extremities: No clubbing, cyanosis or edema. DP/PT/Radials 2+ and equal bilaterally. Neuro: Alert and oriented X 3. No focal deficits noted. Psych:  Good affect, responds appropriately Skin: No rashes or lesions noted.  Labs:   Lab Results  Component Value Date   WBC  10.2 05/31/2016   HGB 13.9 05/31/2016   HCT 41.9 05/31/2016   MCV 82.6 05/31/2016   PLT 245 05/31/2016   No results for input(s): INR in the last 72 hours.  Recent Labs Lab 05/28/16 1439 05/31/16 1004  NA 136 141  K 3.5 3.9  CL 103 104  CO2 24 29  BUN 8 8  CREATININE 1.10 1.01  CALCIUM 9.0 8.7*  PROT 7.2  --   BILITOT 1.1  --   ALKPHOS 63  --   ALT 13*  --   AST 21  --   GLUCOSE 128* 111*  ALBUMIN 4.3  --    No results found for: MG  Recent Labs  05/31/16 0853 05/31/16 1421 05/31/16 2036  TROPONINI 0.55* 0.48* 0.51*   No results for input(s): TROPIPOC in the last 72 hours. No results found for: PROBNP No results found for: CHOL, HDL, LDLCALC, TRIG No results found for: DDIMER Lipase  Date/Time Value Ref Range Status  05/27/2016 07:01 PM 27 11 - 51 U/L Final   No results found for: TSH, T4TOTAL, T3FREE, THYROIDAB Retic Ct Pct  Date/Time Value Ref Range Status  05/30/2016 04:36 AM 0.6 0.4 - 3.1 % Final    Echo:  LV EF: 55% -   60%  ------------------------------------------------------------------- Indications:      Abnormal EKG 794.31.  Chest pain 786.51.  ------------------------------------------------------------------- History:   PMH:  Migraine. Depression.  ------------------------------------------------------------------- Study Conclusions  - Left ventricle: The cavity size was normal. There was mild focal   basal hypertrophy of the septum. Systolic function was normal.   The estimated ejection fraction was in the range of 55% to 60%.   Wall motion was normal; there were no regional wall motion   abnormalities. Left ventricular diastolic function parameters   were normal. - Mitral valve: There was mild regurgitation. - Pulmonic valve: There was trivial regurgitation.   Radiology:  No results found.  ASSESSMENT AND PLAN:     1. Elevated troponin - Flat trend with one episode of chest pain with cough. Now resolved. Given fever of  unknown origin, will do cardiac MRI as outpatient to ro myopericarditis. Echo showed normal LV EF without wm abnormality.   SignedLeanor Kail, PA 06/01/2016, 10:50 AM Pager [phone]  Co-Sign MD  Patient examined chart reviewed. Pain is atypical and non cardiac in setting of diffuse myalgias And likely sickle crisis. ECG was incorrectly interpreted as lateral infarct It represents limb lead Reversal. Echo is normal with no effusion and no RWMA;s Ok to d/c home will try to arrange Outpatient cardiac MRI to r/o myocarditis Have ordered ESR and CPK with MB Exam benign Except for red hair coloring   Jenkins Rouge

## 2016-06-01 NOTE — Progress Notes (Addendum)
Patient c/o swelling in R arm this AM.   Patient's IV removed d/t infiltration.  Patient's IV site is swollen but rest of arm is warm and capillary refill is less than 3 seconds. Will hold off on IV restart at this time pending MD rounds d/t potential discharge per patient request.

## 2016-06-01 NOTE — Discharge Summary (Signed)
Guy [name] MRN: [id number] DOB/AGE: 1995-07-04 20 y.o.  Admit date: 05/27/2016 Discharge date: 06/01/2016  Primary Care Physician:  [facility]   Discharge Diagnoses:   Patient Active Problem List   Diagnosis Date Noted  . Chest wall pain 05/31/2016  . Fever 05/28/2016  . Adjustment disorder with depressed mood 04/25/2016  . Attention deficit hyperactivity disorder (ADHD) 04/25/2016    DISCHARGE MEDICATION:   Medication List    TAKE these medications   methylphenidate 36 MG CR tablet Commonly known as:  CONCERTA Take 36 mg by mouth daily.         Consults: Treatment Team:  Rounding Lbcardiology, MD   SIGNIFICANT DIAGNOSTIC STUDIES:  Dg Chest 2 View  Result Date: 05/27/2016 CLINICAL DATA:  Fever and vomiting EXAM: CHEST  2 VIEW COMPARISON:  07/20/2015 FINDINGS: The heart size and mediastinal contours are within normal limits. Both lungs are clear. The visualized skeletal structures are unremarkable. IMPRESSION: No active cardiopulmonary disease. Electronically Signed   By: Jasmine PangKim  Fujinaga M.D.   On: 05/27/2016 22:46   Dg Chest Port 1 View  Result Date: 05/28/2016 CLINICAL DATA:  Shortness of breath, weakness and cough. Fever. Sepsis. EXAM: PORTABLE CHEST 1 VIEW COMPARISON:  05/27/2016 FINDINGS: The heart size and mediastinal contours are within normal limits. Both lungs are clear. The visualized skeletal structures are unremarkable. Negative for a pneumothorax. IMPRESSION: No acute chest disease. Electronically Signed   By: Richarda OverlieAdam  Henn M.D.   On: 05/28/2016 14:54     ECHO: -Left ventricle: The cavity size was normal. There was mild focal   basal hypertrophy of the septum. Systolic function was normal.   The estimated ejection fraction was in the range of 55% to 60%.   Wall motion was normal; there were no regional wall motion   abnormalities. Left ventricular diastolic function parameters   were normal. - Mitral valve: There was mild regurgitation. -  Pulmonic valve: There was trivial regurgitation.       Recent Results (from the past 240 hour(s))  Culture, blood (Routine X 2) w Reflex to ID Panel     Status: None (Preliminary result)   Collection Time: 05/28/16 11:10 AM  Result Value Ref Range Status   Specimen Description BLOOD RIGHT ARM  Final   Special Requests NONE  Final   Culture   Final    NO GROWTH 3 DAYS Performed at [facility] [place]    Report Status PENDING  Incomplete  Culture, blood (Routine X 2) w Reflex to ID Panel     Status: None (Preliminary result)   Collection Time: 05/28/16 11:20 AM  Result Value Ref Range Status   Specimen Description BLOOD RIGHT ARM  Final   Special Requests NONE  Final   Culture   Final    NO GROWTH 3 DAYS Performed at [facility]    Report Status PENDING  Incomplete  Urine culture     Status: None   Collection Time: 05/28/16  3:40 PM  Result Value Ref Range Status   Specimen Description URINE, CLEAN CATCH  Final   Special Requests NONE  Final   Culture NO GROWTH [facility]formed [facility]    Final   Report Status 05/29/2016 FINAL  Final  Culture, blood (x 2)     Status: None (Preliminary result)   Collection Time: 05/28/16  6:33 PM  Result Value Ref Range Status   Specimen Description BLOOD RIGHT ANTECUBITAL  Final   Special Requests BOTTLES DRAWN AEROBIC ONLY  4CC  Final   Culture   Final    NO GROWTH 2 DAYS Performed at [facility]    Report Status PENDING  Incomplete  Culture, blood (x 2)     Status: None (Preliminary result)   Collection Time: 05/28/16  6:33 PM  Result Value Ref Range Status   Specimen Description BLOOD BLOOD RIGHT FOREARM  Final   Special Requests IN PEDIATRIC BOTTLE 2CC  Final   Culture   Final    NO GROWTH 2 DAYS Performed at [facility]    Report Status PENDING  Incomplete    BRIEF ADMITTING H & P: Guy [name] is a 20 y.o. gentleman with a history of sickle cell trait, ADHD, depression, and migraine  headaches who presents to the ED for evaluation of nausea, vomiting, and pain in multiple joints.  He reports a single episode of vomiting, but he says that was blood tinged.  No associated diarrhea.  No subjective fevers, chills, or sweats, but fever to 102.4 documented in the ED tonight.  No significant cough or congestion.  No known sick contacts.  He is complaining of pain in multiple joints (knees, ankles, feet, back) as well as headache.  He is also complaining of abdominal pain.  ED Course: Normal WBC count.  Normal U/A.  Normal chest xray.  He is feeling light-headed after receiving 3mg  of IV dilaudid in the ED.  He is asking to eat.  ED requesting observation given fever and acute pain (patient considered high risk for decompensation).     Hospital Course:  Present on Admission:  Fever: Pt was felt to have a viral illness. He was initially started on IV antibiotics. However without any source of infection the antibiotics were discontinue and he was observed without any antibiotics. He had blood cultures and Urinalysis all of which are negative to date. He has been afebrile for more than 24 hours. No further care indicated.   Chest Wall Pain: Pt was ready for discharge when he c/o chest wall pain. An EKG done per protocol showed early repolarization which is disputed by Cardiology to be limb lead reversal. Troponin was evaluated which showed a mildly elevated flattened trend.  He had an ECHO performed which was not significant for any abnormal findings except for mild focal basal hypertrophy of the septum. He was seen in consult by Cardiology who recommended a follow up cardiac MRI to r/o myocarditis.   ADHD: Pt is continued on Methylphenidate at discharge.    Disposition and Follow-up: Pt is discharged home in good condition and is to follow up with his PMD- Dr. Concepcion ElkAvbuere within 1 week. Cardiology will arrange for date and time of Cardiac MRI.    DISCHARGE EXAM:  General: Alert, awake,  oriented x3. Well appearing. HEENT: [place]/AT PEERL, EOMI, anicteric Neck: Trachea midline, no masses, no thyromegal,y no JVD, no carotid bruit OROPHARYNX: Moist, No exudate/ erythema/lesions.  Heart: Regular rate and rhythm, without murmurs, rubs, gallops or S3. PMI non-displaced. Exam reveals no decreased pulses. Pulmonary/Chest: Normal effort. Breath sounds normal. No. Apnea. Clear to auscultation,no stridor,  no wheezing and no rhonchi noted. No respiratory distress and no tenderness noted. Abdomen: Soft, nontender, nondistended, normal bowel sounds, no masses no hepatosplenomegaly noted. No fluid wave and no ascites. There is no guarding or rebound. Neuro: Alert and oriented to person, place and time. Normal motor skills, Displays no atrophy or tremors and exhibits normal muscle tone.  No focal neurological deficits noted cranial nerves  II through XII grossly intact. No sensory deficit noted.  Strength at baseline in bilateral upper and lower extremities. Gait normal. Musculoskeletal: No warmth swelling or erythema around joints, no spinal tenderness noted. Psychiatric: Patient alert and oriented x3, good insight and cognition, good recent to remote recall. Mood, affect and judgement normal Skin: Skin is warm and dry. No bruising, no ecchymosis and no rash noted. Pt is not diaphoretic. No erythema. No pallor    Blood pressure 119/73, pulse 84, temperature 98.2 F (36.8 C), temperature source Oral, resp. rate 20, height 5\' 8"  (1.727 m), weight 47.6 kg (105 lb), SpO2 100 %.   Recent Labs  05/31/16 1004  NA 141  K 3.9  CL 104  CO2 29  GLUCOSE 111*  BUN 8  CREATININE 1.01  CALCIUM 8.7*   No results for input(s): AST, ALT, ALKPHOS, BILITOT, PROT, ALBUMIN in the last 72 hours. No results for input(s): LIPASE, AMYLASE in the last 72 hours.  Recent Labs  05/30/16 0436 05/31/16 1004  WBC 11.8* 10.2  NEUTROABS 9.0* 7.4  HGB 13.7 13.9  HCT 41.7 41.9  MCV 82.4 82.6  PLT 214 245      Total time spent including face to face and decision making was greater than 30 minutes  Signed: Juli Odom A. 06/01/2016, 2:57 PM

## 2016-06-01 NOTE — Discharge Instructions (Signed)
Myocarditis, Adult °Myocarditis is a swelling (inflammation) of the heart muscle (myocardium). When the heart becomes inflamed, it cannot pump as well. Severe cases of myocarditis can cause heart failure. °What are the causes? °It is usually caused by a viral infection, although other causes are possible. °What are the signs or symptoms? °Mild cases of myocarditis may not have symptoms. If symptoms do occur, they may include: °· Chest pain. °· Shortness of breath. °· Fast or abnormal heart rhythms. °· Fatigue. °· Fluid retention or swelling in the feet or legs. °· Fever. °· Body aches. °· Fainting. ° °How is this diagnosed? °Myocarditis can be hard to diagnose because it can mimic other diseases. Myocarditis may be suspected if the above symptoms have appeared after a recent infection. A physical exam and other tests may be used to confirm the diagnosis. Some of these tests are: °· Blood tests to check for signs of infection or heart damage. °· Electrocardiography that shows your heart's electrical patterns and rhythms. °· A chest X-ray exam to look at your heart and lungs. °· Echocardiography or other imaging tests to look at how well your heart is working. °· Your health care provider also may recommend a heart (cardiac) catheterization. In this test, a flexible tube (catheter) is inserted into a blood vessel then threaded into the heart. A special instrument can then remove tiny samples of heart muscle tissue (biopsy). The samples are sent to a lab for analysis. ° °How is this treated? °Treatment of myocarditis is aimed at treating the underlying cause and may involve: °· Heart medicine such as beta blockers or angiotensin-converting enzyme (ACE) inhibitors. These help strengthen the heart and help it beat more regularly. °· Diuretic medicine. Extra fluid in the body can make the heart work harder. Diuretic medicine can help get rid of the extra fluid. °· Steroid medicine. In some cases of myocarditis, steroid  medicine is used to reduce swelling. ° °This information is not intended to replace advice given to you by your health care provider. Make sure you discuss any questions you have with your health care provider. °Document Released: 05/05/2005 Document Revised: 11/19/2015 Document Reviewed: 11/05/2012 °Elsevier Interactive Patient Education © 2017 [employer] ° °

## 2016-06-01 NOTE — Care Management Note (Signed)
Case Management Note  Patient Details  Name: Juleen StarrRayquan L Villaflor MRN: [id number] Date of Birth: [birth year]  Subjective/Objective: 20 y/o m admitted w/Fever. From home.                   Action/Plan:d/c home.   Expected Discharge Date:                  Expected Discharge Plan:  Home/Self Care  In-House Referral:     Discharge planning Services  CM Consult  Post Acute Care Choice:    Choice offered to:     DME Arranged:    DME Agency:     HH Arranged:    HH Agency:     Status of Service:  In process, will continue to follow  If discussed at Long Length of Stay Meetings, dates discussed:    Additional Comments:  Lanier ClamMahabir, Kyro Joswick, RN 06/01/2016, 2:10 PM

## 2016-06-02 LAB — CULTURE, BLOOD (ROUTINE X 2)
Culture: NO GROWTH
Culture: NO GROWTH

## 2016-06-03 ENCOUNTER — Encounter: Payer: Self-pay | Admitting: Cardiovascular Disease

## 2016-06-03 ENCOUNTER — Telehealth: Payer: Self-pay | Admitting: Physician Assistant

## 2016-06-03 ENCOUNTER — Telehealth: Payer: Self-pay | Admitting: Cardiovascular Disease

## 2016-06-03 LAB — CULTURE, BLOOD (ROUTINE X 2)
Culture: NO GROWTH
Culture: NO GROWTH

## 2016-06-03 NOTE — Telephone Encounter (Signed)
Called patient back and left a voicemail due to the rescheduling of his cardiac MRI to 06-07-16 at 10:30 a.m.  I left my name and number to call me back to verify that he got the message.

## 2016-06-03 NOTE — Telephone Encounter (Signed)
Called and left message for patient regarding cardiac [facility] appointment [facility] on 06-15-16.

## 2016-06-07 ENCOUNTER — Ambulatory Visit (HOSPITAL_COMMUNITY): Admission: RE | Admit: 2016-06-07 | Payer: Medicaid Other | Source: Ambulatory Visit

## 2016-06-14 ENCOUNTER — Encounter: Payer: Self-pay | Admitting: Physician Assistant

## 2016-06-15 ENCOUNTER — Encounter: Payer: Self-pay | Admitting: Physician Assistant

## 2016-06-15 ENCOUNTER — Other Ambulatory Visit (HOSPITAL_COMMUNITY): Payer: Self-pay

## 2016-07-01 ENCOUNTER — Ambulatory Visit (HOSPITAL_COMMUNITY): Admission: RE | Admit: 2016-07-01 | Payer: Medicaid Other | Source: Ambulatory Visit

## 2016-07-14 ENCOUNTER — Encounter (HOSPITAL_COMMUNITY): Payer: Self-pay

## 2016-07-14 ENCOUNTER — Emergency Department (HOSPITAL_COMMUNITY)
Admission: EM | Admit: 2016-07-14 | Discharge: 2016-07-14 | Disposition: A | Discharge status: Home / Self Care | Payer: Medicaid Other | Attending: Emergency Medicine | Admitting: Emergency Medicine

## 2016-07-14 DIAGNOSIS — Z5321 Procedure and treatment not carried out due to patient leaving prior to being seen by health care provider: Secondary | ICD-10-CM | POA: Insufficient documentation

## 2016-07-14 DIAGNOSIS — F909 Attention-deficit hyperactivity disorder, unspecified type: Secondary | ICD-10-CM | POA: Diagnosis not present

## 2016-07-14 DIAGNOSIS — R05 Cough: Secondary | ICD-10-CM | POA: Diagnosis not present

## 2016-07-14 LAB — RAPID STREP SCREEN (MED CTR MEBANE ONLY): STREPTOCOCCUS, GROUP A SCREEN (DIRECT): NEGATIVE

## 2016-07-14 NOTE — ED Triage Notes (Signed)
Pt states he started to cough up green mucus on yesterday with some chest congestion; pt states he hurts to breath at times; Pt states fever on yesterday but afebrile at triage. Pt c/o 8/10 of sore throat.

## 2016-07-17 LAB — CULTURE, GROUP A STREP (THRC)

## 2016-10-03 ENCOUNTER — Emergency Department (HOSPITAL_COMMUNITY): Payer: Medicaid Other

## 2016-10-03 ENCOUNTER — Emergency Department (HOSPITAL_COMMUNITY)
Admission: EM | Admit: 2016-10-03 | Discharge: 2016-10-03 | Disposition: A | Discharge status: Home / Self Care | Payer: Medicaid Other | Attending: Emergency Medicine | Admitting: Emergency Medicine

## 2016-10-03 ENCOUNTER — Encounter (HOSPITAL_COMMUNITY): Payer: Self-pay | Admitting: Adult Health

## 2016-10-03 DIAGNOSIS — S6010XA Contusion of unspecified finger with damage to nail, initial encounter: Secondary | ICD-10-CM

## 2016-10-03 DIAGNOSIS — Y929 Unspecified place or not applicable: Secondary | ICD-10-CM | POA: Diagnosis not present

## 2016-10-03 DIAGNOSIS — Y999 Unspecified external cause status: Secondary | ICD-10-CM | POA: Insufficient documentation

## 2016-10-03 DIAGNOSIS — S60151A Contusion of right little finger with damage to nail, initial encounter: Secondary | ICD-10-CM | POA: Diagnosis not present

## 2016-10-03 DIAGNOSIS — Y9389 Activity, other specified: Secondary | ICD-10-CM | POA: Diagnosis not present

## 2016-10-03 DIAGNOSIS — F909 Attention-deficit hyperactivity disorder, unspecified type: Secondary | ICD-10-CM | POA: Insufficient documentation

## 2016-10-03 DIAGNOSIS — S6991XA Unspecified injury of right wrist, hand and finger(s), initial encounter: Secondary | ICD-10-CM | POA: Diagnosis present

## 2016-10-03 DIAGNOSIS — S6000XA Contusion of unspecified finger without damage to nail, initial encounter: Secondary | ICD-10-CM

## 2016-10-03 HISTORY — DX: Bipolar disorder, unspecified: F31.9

## 2016-10-03 HISTORY — DX: Post-traumatic stress disorder, unspecified: F43.10
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
## 2016-10-03 NOTE — ED Notes (Signed)
Pt verbalized understanding discharge instructions and denies any further needs or questions at this time. VS stable, ambulatory and steady gait.   

## 2017-03-03 ENCOUNTER — Telehealth: Payer: Self-pay

## 2017-03-03 NOTE — Telephone Encounter (Signed)
Tried to call patient about a previous message. Patient was seen in the hospital for chest pain and was scheduled for MRI. Patient canceled MRI. Following up with patient to make sure he is still asymptomatic. If patient has symptoms, he will need a follow-up appointment with Dr. Eden EmmsNishan. Called patient, phone rang busy, will try again later.

## 2017-03-24 NOTE — Telephone Encounter (Signed)
Patient phone rang busy. Will try again later.

## 2017-03-29 NOTE — Telephone Encounter (Signed)
Tried to call patient for 3rd time, phone rang busy. Will send letter to patient for him to give our office a call.

## 2017-09-05 ENCOUNTER — Encounter (HOSPITAL_COMMUNITY): Payer: Self-pay

## 2017-09-05 ENCOUNTER — Other Ambulatory Visit: Payer: Self-pay

## 2017-09-05 DIAGNOSIS — Z79899 Other long term (current) drug therapy: Secondary | ICD-10-CM | POA: Insufficient documentation

## 2017-09-05 DIAGNOSIS — F332 Major depressive disorder, recurrent severe without psychotic features: Secondary | ICD-10-CM | POA: Insufficient documentation

## 2017-09-05 DIAGNOSIS — F909 Attention-deficit hyperactivity disorder, unspecified type: Secondary | ICD-10-CM | POA: Insufficient documentation

## 2017-09-05 DIAGNOSIS — Y9389 Activity, other specified: Secondary | ICD-10-CM | POA: Insufficient documentation

## 2017-09-05 DIAGNOSIS — R45851 Suicidal ideations: Secondary | ICD-10-CM | POA: Insufficient documentation

## 2017-09-05 DIAGNOSIS — Y998 Other external cause status: Secondary | ICD-10-CM | POA: Insufficient documentation

## 2017-09-05 DIAGNOSIS — Y929 Unspecified place or not applicable: Secondary | ICD-10-CM | POA: Insufficient documentation

## 2017-09-05 DIAGNOSIS — F4321 Adjustment disorder with depressed mood: Secondary | ICD-10-CM | POA: Insufficient documentation

## 2017-09-05 DIAGNOSIS — F17200 Nicotine dependence, unspecified, uncomplicated: Secondary | ICD-10-CM | POA: Insufficient documentation

## 2017-09-05 LAB — CBC
HCT: 47.1 % (ref 39.0–52.0)
Hemoglobin: 16.1 g/dL (ref 13.0–17.0)
MCH: 28.6 pg (ref 26.0–34.0)
MCHC: 34.2 g/dL (ref 30.0–36.0)
MCV: 83.7 fL (ref 78.0–100.0)
PLATELETS: 187 10*3/uL (ref 150–400)
RBC: 5.63 MIL/uL (ref 4.22–5.81)
RDW: 13.7 % (ref 11.5–15.5)
WBC: 7.3 10*3/uL (ref 4.0–10.5)
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
## 2017-09-06 ENCOUNTER — Emergency Department (HOSPITAL_COMMUNITY)
Admission: EM | Admit: 2017-09-06 | Discharge: 2017-09-06 | Payer: Self-pay | Attending: Emergency Medicine | Admitting: Emergency Medicine

## 2017-09-06 DIAGNOSIS — R45851 Suicidal ideations: Secondary | ICD-10-CM

## 2017-09-06 LAB — COMPREHENSIVE METABOLIC PANEL
ALT: 15 U/L — ABNORMAL LOW (ref 17–63)
AST: 24 U/L (ref 15–41)
Albumin: 4.5 g/dL (ref 3.5–5.0)
Alkaline Phosphatase: 75 U/L (ref 38–126)
Anion gap: 12 (ref 5–15)
BUN: 8 mg/dL (ref 6–20)
CHLORIDE: 101 mmol/L (ref 101–111)
CO2: 22 mmol/L (ref 22–32)
Calcium: 9.1 mg/dL (ref 8.9–10.3)
Creatinine, Ser: 1.06 mg/dL (ref 0.61–1.24)
GFR calc Af Amer: >60 mL/min (ref >60)
GFR calc non Af Amer: >60 mL/min (ref >60)
Glucose, Bld: 105 mg/dL — ABNORMAL HIGH (ref 65–99)
POTASSIUM: 3.7 mmol/L (ref 3.5–5.1)
SODIUM: 135 mmol/L (ref 135–145)
Total Bilirubin: 1.2 mg/dL (ref 0.3–1.2)
Total Protein: 7.4 g/dL (ref 6.5–8.1)

## 2017-09-06 LAB — RAPID URINE DRUG SCREEN, HOSP PERFORMED
Amphetamines: NOT DETECTED
BENZODIAZEPINES: NOT DETECTED
Barbiturates: NOT DETECTED
COCAINE: NOT DETECTED
OPIATES: NOT DETECTED
Tetrahydrocannabinol: NOT DETECTED

## 2017-09-06 LAB — SALICYLATE LEVEL

## 2017-09-06 LAB — ETHANOL: ALCOHOL ETHYL (B): 123 mg/dL — AB (ref <10)

## 2017-09-06 LAB — ACETAMINOPHEN LEVEL: Acetaminophen (Tylenol), Serum: <10 ug/mL — ABNORMAL LOW (ref 10–30)
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
## 2017-09-06 NOTE — ED Notes (Signed)
TTS being done at bedside 

## 2017-09-06 NOTE — Discharge Instructions (Signed)
[name] must be on suicide watch while in jail.

## 2017-09-06 NOTE — ED Provider Notes (Signed)
Pt has been evaluated by TTS for thoughts of suicide.  The pt's suicidal thoughts are situational due to pending jail time.  He told TTS he would be able to be safe at home, but would feel suicidal if he had to go to jail.  The pt is able to be placed on suicide watch at the jail.  Pt is stable for d/c to the jail.   Jacalyn LefevreHaviland, Belem Hintze, MD 09/06/17 1011

## 2017-09-06 NOTE — ED Notes (Signed)
Regular breakfast tray ordered.  
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
## 2017-09-06 NOTE — BHH Counselor (Signed)
TTS writer called to inform [facility] patient was psych-cleared. TTS Clinical research associatewriter spoke with Nehemiah SettleBrooke, Charity fundraiserN.   TTS Clinical research associatewriter spoke with Warden/rangerofficer Nance with the [employer]. TTS Clinical research associatewriter informed the officer patient suicidal ideation was situational and patient stated if he was discharged home he would not be suicidal but because he is going to be discharged into police custody he's suicidal. TTS report patient stated he does not like jail. When he was in jail in Aug. or Sept. 2018 he banged his head against the window and was placed on suicidal watch. TTS writer report recommendation is patient is psych-cleared and to be on suicidal watch in the jail.   Officer Nance with [employer] reported he would inform the jail of the patient disposition.

## 2017-12-31 ENCOUNTER — Encounter (HOSPITAL_COMMUNITY): Payer: Self-pay | Admitting: Emergency Medicine

## 2017-12-31 ENCOUNTER — Emergency Department (HOSPITAL_COMMUNITY)
Admission: EM | Admit: 2017-12-31 | Discharge: 2018-01-01 | Disposition: A | Discharge status: Home / Self Care | Payer: Self-pay | Attending: Emergency Medicine | Admitting: Emergency Medicine

## 2017-12-31 DIAGNOSIS — Z79899 Other long term (current) drug therapy: Secondary | ICD-10-CM | POA: Insufficient documentation

## 2017-12-31 DIAGNOSIS — Y999 Unspecified external cause status: Secondary | ICD-10-CM | POA: Insufficient documentation

## 2017-12-31 DIAGNOSIS — Y9389 Activity, other specified: Secondary | ICD-10-CM | POA: Insufficient documentation

## 2017-12-31 DIAGNOSIS — T148XXA Other injury of unspecified body region, initial encounter: Secondary | ICD-10-CM

## 2017-12-31 DIAGNOSIS — S0083XA Contusion of other part of head, initial encounter: Secondary | ICD-10-CM

## 2017-12-31 DIAGNOSIS — W51XXXA Accidental striking against or bumped into by another person, initial encounter: Secondary | ICD-10-CM | POA: Insufficient documentation

## 2017-12-31 DIAGNOSIS — F17210 Nicotine dependence, cigarettes, uncomplicated: Secondary | ICD-10-CM | POA: Insufficient documentation

## 2017-12-31 DIAGNOSIS — W01119A Fall on same level from slipping, tripping and stumbling with subsequent striking against unspecified sharp object, initial encounter: Secondary | ICD-10-CM | POA: Insufficient documentation

## 2017-12-31 DIAGNOSIS — S01521A Laceration with foreign body of lip, initial encounter: Secondary | ICD-10-CM | POA: Insufficient documentation

## 2017-12-31 DIAGNOSIS — S0181XA Laceration without foreign body of other part of head, initial encounter: Secondary | ICD-10-CM | POA: Insufficient documentation

## 2017-12-31 DIAGNOSIS — Y9289 Other specified places as the place of occurrence of the external cause: Secondary | ICD-10-CM | POA: Insufficient documentation

## 2017-12-31 DIAGNOSIS — Z23 Encounter for immunization: Secondary | ICD-10-CM | POA: Insufficient documentation
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
## 2018-01-01 ENCOUNTER — Emergency Department (HOSPITAL_COMMUNITY): Payer: Self-pay

## 2018-01-01 MED ORDER — DIPHENHYDRAMINE HCL 50 MG/ML IJ SOLN
12.5000 mg | Freq: Once | INTRAMUSCULAR | Status: AC
  Administered 2018-01-01: 12.5 mg via INTRAVENOUS
  Filled 2018-01-01: qty 1

## 2018-01-01 MED ORDER — CLINDAMYCIN HCL 150 MG PO CAPS
450.0000 mg | ORAL_CAPSULE | Freq: Once | ORAL | Status: AC
Start: 2018-01-01 — End: 2018-01-01
  Administered 2018-01-01: 450 mg via ORAL
  Filled 2018-01-01: qty 3

## 2018-01-01 MED ORDER — TETANUS-DIPHTH-ACELL PERTUSSIS 5-2.5-18.5 LF-MCG/0.5 IM SUSP
0.5000 mL | Freq: Once | INTRAMUSCULAR | Status: AC
  Administered 2018-01-01: 0.5 mL via INTRAMUSCULAR
  Filled 2018-01-01: qty 0.5

## 2018-01-01 MED ORDER — METOCLOPRAMIDE HCL 5 MG/ML IJ SOLN
10.0000 mg | Freq: Once | INTRAMUSCULAR | Status: AC
  Administered 2018-01-01: 10 mg via INTRAVENOUS
  Filled 2018-01-01: qty 2

## 2018-01-01 MED ORDER — SODIUM CHLORIDE 0.9 % IV BOLUS
1000.0000 mL | Freq: Once | INTRAVENOUS | Status: AC
  Administered 2018-01-01: 1000 mL via INTRAVENOUS

## 2018-01-01 MED ORDER — CLINDAMYCIN HCL 150 MG PO CAPS: 450.0000 mg | ORAL_CAPSULE | Freq: Three times a day (TID) | ORAL | 0 refills | Status: DC
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
## 2018-01-01 NOTE — ED Notes (Signed)
Pt c/o vomiting x 1 day, and headache. Hx migraine.

## 2018-01-01 NOTE — ED Notes (Signed)
Patient verbalizes understanding of medications and discharge instructions. VSS and patient ambulatory at discharge.

## 2018-01-01 NOTE — Discharge Instructions (Addendum)
1. Medications: Clindamycin, usual home medications 2. Treatment: rest, drink plenty of fluids, keep wounds clean with warm soap and water, warm compresses 3. Follow Up: Please followup with ENT in 3-5 days for discussion of your diagnoses and further evaluation after today's visit; if you do not have a primary care doctor use the resource guide provided to find one; Please return to the ER for fevers, worsening pain, worsening signs of infection or other concerns

## 2018-01-01 NOTE — ED Notes (Signed)
Patient given PO fluids, tolerating well.  

## 2018-08-13 ENCOUNTER — Other Ambulatory Visit: Payer: Self-pay

## 2018-08-13 ENCOUNTER — Emergency Department (HOSPITAL_COMMUNITY)
Admission: EM | Admit: 2018-08-13 | Discharge: 2018-08-14 | Disposition: A | Discharge status: Home / Self Care | Payer: Self-pay | Attending: Emergency Medicine | Admitting: Emergency Medicine

## 2018-08-13 DIAGNOSIS — F1020 Alcohol dependence, uncomplicated: Secondary | ICD-10-CM | POA: Insufficient documentation

## 2018-08-13 DIAGNOSIS — F17200 Nicotine dependence, unspecified, uncomplicated: Secondary | ICD-10-CM | POA: Insufficient documentation

## 2018-08-13 DIAGNOSIS — Z5900 Homelessness unspecified: Secondary | ICD-10-CM

## 2018-08-13 DIAGNOSIS — F10920 Alcohol use, unspecified with intoxication, uncomplicated: Secondary | ICD-10-CM

## 2018-08-13 DIAGNOSIS — R45851 Suicidal ideations: Secondary | ICD-10-CM

## 2018-08-13 DIAGNOSIS — Z79899 Other long term (current) drug therapy: Secondary | ICD-10-CM | POA: Insufficient documentation

## 2018-08-13 DIAGNOSIS — F332 Major depressive disorder, recurrent severe without psychotic features: Secondary | ICD-10-CM | POA: Insufficient documentation

## 2018-08-13 NOTE — ED Notes (Addendum)
Pt changed into dry scrubs after arriving from [facility] in soiled clothes. Belongings placed in bags and are at bedside.

## 2018-08-13 NOTE — ED Notes (Signed)
Bed: [facility] Expected date:  Expected time:  Means of arrival:  Comments: ETOH 

## 2018-08-13 NOTE — ED Triage Notes (Signed)
Pt came to the ED from [facility] due to ETOH. Pt reports drinking 3, 24oz beers. Hx of sickle cell

## 2018-08-14 LAB — CBC WITH DIFFERENTIAL/PLATELET
ABS IMMATURE GRANULOCYTES: 0.02 10*3/uL (ref 0.00–0.07)
BASOS ABS: 0 10*3/uL (ref 0.0–0.1)
Basophils Relative: 0 %
EOS PCT: 1 %
Eosinophils Absolute: 0.1 10*3/uL (ref 0.0–0.5)
HEMATOCRIT: 55 % — AB (ref 39.0–52.0)
HEMOGLOBIN: 17.8 g/dL — AB (ref 13.0–17.0)
IMMATURE GRANULOCYTES: 0 %
LYMPHS ABS: 2.8 10*3/uL (ref 0.7–4.0)
LYMPHS PCT: 42 %
MCH: 28 pg (ref 26.0–34.0)
MCHC: 32.4 g/dL (ref 30.0–36.0)
MCV: 86.6 fL (ref 80.0–100.0)
Monocytes Absolute: 0.5 10*3/uL (ref 0.1–1.0)
Monocytes Relative: 7 %
NEUTROS ABS: 3.2 10*3/uL (ref 1.7–7.7)
NEUTROS PCT: 50 %
Platelets: 298 10*3/uL (ref 150–400)
RBC: 6.35 MIL/uL — ABNORMAL HIGH (ref 4.22–5.81)
RDW: 14.3 % (ref 11.5–15.5)
WBC: 6.6 10*3/uL (ref 4.0–10.5)
nRBC: 0 % (ref 0.0–0.2)

## 2018-08-14 LAB — COMPREHENSIVE METABOLIC PANEL
ALBUMIN: 5.3 g/dL — AB (ref 3.5–5.0)
ALK PHOS: 76 U/L (ref 38–126)
ALT: 20 U/L (ref 0–44)
AST: 26 U/L (ref 15–41)
Anion gap: 11 (ref 5–15)
BUN: 11 mg/dL (ref 6–20)
CALCIUM: 9.4 mg/dL (ref 8.9–10.3)
CHLORIDE: 108 mmol/L (ref 98–111)
CO2: 22 mmol/L (ref 22–32)
Creatinine, Ser: 0.96 mg/dL (ref 0.61–1.24)
GFR calc Af Amer: >60 mL/min (ref >60)
GFR calc non Af Amer: >60 mL/min (ref >60)
GLUCOSE: 94 mg/dL (ref 70–99)
Potassium: 4 mmol/L (ref 3.5–5.1)
SODIUM: 141 mmol/L (ref 135–145)
Total Bilirubin: 0.9 mg/dL (ref 0.3–1.2)
Total Protein: 8.5 g/dL — ABNORMAL HIGH (ref 6.5–8.1)

## 2018-08-14 LAB — RETICULOCYTES
IMMATURE RETIC FRACT: 3.7 % (ref 2.3–15.9)
RBC.: 6.35 MIL/uL — AB (ref 4.22–5.81)
RETIC COUNT ABSOLUTE: 68.6 10*3/uL (ref 19.0–186.0)
RETIC CT PCT: 1.1 % (ref 0.4–3.1)

## 2018-08-14 LAB — ETHANOL: Alcohol, Ethyl (B): 354 mg/dL (ref <10)

## 2018-08-14 LAB — RAPID URINE DRUG SCREEN, HOSP PERFORMED
Amphetamines: NOT DETECTED
BENZODIAZEPINES: NOT DETECTED
Barbiturates: NOT DETECTED
COCAINE: NOT DETECTED
OPIATES: NOT DETECTED
Tetrahydrocannabinol: NOT DETECTED

## 2018-08-14 MED ORDER — CHLORDIAZEPOXIDE HCL 25 MG PO CAPS: ORAL_CAPSULE | ORAL | 0 refills | Status: DC

## 2018-08-14 MED ORDER — ACETAMINOPHEN 325 MG PO TABS
650.0000 mg | ORAL_TABLET | Freq: Once | ORAL | Status: AC
  Administered 2018-08-14: 650 mg via ORAL
  Filled 2018-08-14: qty 2
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
## 2018-08-14 NOTE — Patient Outreach (Signed)
CPSS met with the patient to provide substance use recovery support and help with substance use treatment resources. Patient has a recent history of alcohol use and is currently experiencing homelessness. Patient states that he is not experiencing any active alcohol withdrawal symptoms except for a headache. CPSS talked to the patient about getting connected to substance use recovery resources and patient stated he was most interested in attending [facility] meetings. CPSS provided the patient with an [facility] meeting list, residential/outpatient substance use treatment list, detox center list, [facility] [place] list, flier for the [facility], and CPSS contact information. CSW will provide the patient with information for homeless shelters in the [place] area. CPSS strongly encouraged the patient to continue to stay in contact with CPSS for further substance use recovery support and help with substance use treatment resources if needed after discharge from the [facility]

## 2018-08-14 NOTE — ED Notes (Signed)
Date and time results received: 08/14/18 00:33   Test: ETOH  Critical Value: 351  Name of Provider Notified: Dr. Lars Mage   Orders Received? Or Actions Taken?: Continue to montior patient and await for new order

## 2018-08-14 NOTE — Discharge Instructions (Signed)
If you develop thoughts of wanting to hurt or kill yourself, kill or hurt anyone else, hearing voices, or any other new/concerning symptoms then return to the ER for evaluation.

## 2018-08-14 NOTE — ED Notes (Signed)
Bed: [facility]ALF Expected date:  Expected time:  Means of arrival:  Comments: 

## 2018-08-14 NOTE — BH Assessment (Signed)
08/14/2018@8 :05 am- TTS went to assess patient and he was unable to be woken up. His BAL was 350.

## 2018-08-14 NOTE — ED Provider Notes (Signed)
Psychiatry has cleared patient for discharge.  He is no longer intoxicated.  He has had some vague suicidal thoughts over the past month or more but no definitive plan at this time and he does not want to commit suicide.  He is offered Librium and would like referral to detox centers. Return precautions.   Pricilla Loveless, MD 08/14/18 1415

## 2018-08-14 NOTE — BH Assessment (Signed)
[facility] Assessment Progress Note  Per Juanetta Beets, DO, this pt does not require psychiatric hospitalization at this time.  Pt is to be discharged from [facility].  He does not want any referral information for follow up, other than contacts for area 12-Step groups, which have been provided by Bartholomew Boards with [employer].  Pt's nurse has been notified.  Doylene Canning, MA Triage Specialist [phone]

## 2018-08-14 NOTE — ED Provider Notes (Signed)
[facility]-EMERGENCY DEPT Provider Note   CSN: [id number] Arrival date & time: 08/13/18  2309  Time seen 11:25 PM  History   Chief Complaint Chief Complaint  Patient presents with  . Alcohol Intoxication    HPI Guy [name] is a 23 y.o. male.     HPI patient states he is here and he waved his hand me.  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  He then states he has been short of breath.  Patient does not appear to be having any difficulty breathing.  He then admits he has been drinking tonight.  He states he and his husband had a disagreement tonight and he "did not want to be bothered".  He said he had a knife and he was threatening to kill himself however his husband took the knife away from him.  He states he still feels like he wants to kill himself.  However I had to wake the patient up to see him.  He states he is under a lot of stress because he needs a heart surgery next month.  He states he started having a cough last night and coughing up not much green mucus.  He denies fever or chest pain.  Patient was picked up at [facility] [facility] for alcohol intoxication.  He states he was living with his husband however now he does not want to go back and see him anymore because "he does not want to be bothered".  He now has no place to live.  PCP Placey, Chales Abrahams, NP   Past Medical History:  Diagnosis Date  . ADHD (attention deficit hyperactivity disorder)   . Bipolar 1 disorder (HCC)   . Depression   . Migraine   . PTSD (post-traumatic stress disorder)     Patient Active Problem List   Diagnosis Date Noted  . Chest wall pain 05/31/2016  . Fever 05/28/2016  . Adjustment disorder with depressed mood 04/25/2016  . Attention deficit hyperactivity disorder (ADHD) 04/25/2016    Past Surgical History:  Procedure Laterality Date  . KNEE SURGERY    . WISDOM TOOTH EXTRACTION          Home  Medications    Prior to Admission medications   Medication Sig Start Date End Date Taking? Authorizing Provider  atomoxetine (STRATTERA) 40 MG capsule Take 40 mg by mouth daily.    [provider]  clindamycin (CLEOCIN) 150 MG capsule Take 3 capsules (450 mg total) by mouth 3 (three) times daily. 01/01/18   Muthersbaugh, Dahlia Client, PA-C  QUEtiapine (SEROQUEL) 25 MG tablet Take 25 mg by mouth at bedtime.    [provider]  sertraline (ZOLOFT) 25 MG tablet Take 25 mg by mouth daily.    [provider]    Family History Family History  Problem Relation Age of Onset  . Sickle cell trait Father     Social History Social History   Tobacco Use  . Smoking status: Current Every Day Smoker    Packs/day: 1.00  . Smokeless tobacco: Never Used  Substance Use Topics  . Alcohol use: Yes  . Drug use: Yes    Types: Marijuana     Allergies   Shellfish allergy   Review of Systems Review of Systems  All other systems reviewed and are negative.    Physical Exam Updated Vital Signs BP (!) 128/92 (BP Location: Left Arm)  Pulse 76   Temp 98 F (36.7 C) (Oral)   Resp 18   SpO2 99%   Vital signs normal    Physical Exam Vitals signs and nursing note reviewed.  Constitutional:      Appearance: Normal appearance.     Comments: Sleeping, easily awakened however he is hard to keep directed on the current topic  HENT:     Head: Normocephalic and atraumatic.     Right Ear: External ear normal.     Left Ear: External ear normal.     Nose: Nose normal.     Mouth/Throat:     Mouth: Mucous membranes are moist.     Pharynx: No oropharyngeal exudate or posterior oropharyngeal erythema.  Eyes:     General: No scleral icterus.    Extraocular Movements: Extraocular movements intact.     Conjunctiva/sclera: Conjunctivae normal.     Pupils: Pupils are equal, round, and reactive to light.  Neck:     Musculoskeletal: Normal range of motion.  Cardiovascular:     Rate  and Rhythm: Normal rate and regular rhythm.  Pulmonary:     Effort: Pulmonary effort is normal. No tachypnea, accessory muscle usage, prolonged expiration, respiratory distress or retractions.     Breath sounds: Normal breath sounds and air entry. No decreased air movement. No decreased breath sounds, wheezing or rhonchi.     Comments: No coughing heard even before I saw the patient and during our interview.     ED Treatments / Results  Labs (all labs ordered are listed, but only abnormal results are displayed) Results for orders placed or performed during the hospital encounter of 08/13/18  Comprehensive metabolic panel  Result Value Ref Range   Sodium 141 135 - 145 mmol/L   Potassium 4.0 3.5 - 5.1 mmol/L   Chloride 108 98 - 111 mmol/L   CO2 22 22 - 32 mmol/L   Glucose, Bld 94 70 - 99 mg/dL   BUN 11 6 - 20 mg/dL   Creatinine, Ser 0.76 0.61 - 1.24 mg/dL   Calcium 9.4 8.9 - 22.6 mg/dL   Total Protein 8.5 (H) 6.5 - 8.1 g/dL   Albumin 5.3 (H) 3.5 - 5.0 g/dL   AST 26 15 - 41 U/L   ALT 20 0 - 44 U/L   Alkaline Phosphatase 76 38 - 126 U/L   Total Bilirubin 0.9 0.3 - 1.2 mg/dL   GFR calc non Af Amer >60 >60 mL/min   GFR calc Af Amer >60 >60 mL/min   Anion gap 11 5 - 15  Ethanol  Result Value Ref Range   Alcohol, Ethyl (B) 354 (HH) <10 mg/dL  CBC with Differential  Result Value Ref Range   WBC 6.6 4.0 - 10.5 K/uL   RBC 6.35 (H) 4.22 - 5.81 MIL/uL   Hemoglobin 17.8 (H) 13.0 - 17.0 g/dL   HCT 33.3 (H) 54.5 - 62.5 %   MCV 86.6 80.0 - 100.0 fL   MCH 28.0 26.0 - 34.0 pg   MCHC 32.4 30.0 - 36.0 g/dL   RDW 63.8 93.7 - 34.2 %   Platelets 298 150 - 400 K/uL   nRBC 0.0 0.0 - 0.2 %   Neutrophils Relative % 50 %   Neutro Abs 3.2 1.7 - 7.7 K/uL   Lymphocytes Relative 42 %   Lymphs Abs 2.8 0.7 - 4.0 K/uL   Monocytes Relative 7 %   Monocytes Absolute 0.5 0.1 - 1.0 K/uL   Eosinophils Relative 1 %  Eosinophils Absolute 0.1 0.0 - 0.5 K/uL   Basophils Relative 0 %   Basophils Absolute  0.0 0.0 - 0.1 K/uL   Immature Granulocytes 0 %   Abs Immature Granulocytes 0.02 0.00 - 0.07 K/uL  Reticulocytes  Result Value Ref Range   Retic Ct Pct 1.1 0.4 - 3.1 %   RBC. 6.35 (H) 4.22 - 5.81 MIL/uL   Retic Count, Absolute 68.6 19.0 - 186.0 K/uL   Immature Retic Fract 3.7 2.3 - 15.9 %  Urine rapid drug screen (hosp performed)  Result Value Ref Range   Opiates NONE DETECTED NONE DETECTED   Cocaine NONE DETECTED NONE DETECTED   Benzodiazepines NONE DETECTED NONE DETECTED   Amphetamines NONE DETECTED NONE DETECTED   Tetrahydrocannabinol NONE DETECTED NONE DETECTED   Barbiturates NONE DETECTED NONE DETECTED   Laboratory interpretation all normal except alcohol intoxication    EKG None  Radiology No results found.  Procedures Procedures (including critical care time)  Medications Ordered in ED Medications - No data to display   Initial Impression / Assessment and Plan / ED Course  I have reviewed the triage vital signs and the nursing notes.  Pertinent labs & imaging results that were available during my care of the patient were reviewed by me and considered in my medical decision making (see chart for details).       Patient has a history of bipolar disorder.  Psych holding labs were ordered.  Patient appears to be very inebriated.  Patient has been sleeping with no distress.  I awakened him at 5 AM and asked him how he is feeling.  He had been again waved his hand back and forth in the air.  When I asked him what that meant he states that nothing was different.  6:15 AM I will have TTS evaluate patient.  7:40 AM patient is still waiting for TTS evaluation.  Final Clinical Impressions(s) / ED Diagnoses   Final diagnoses:  Alcoholic intoxication without complication (HCC)  Suicidal ideation  Homeless    Disposition pending  Devoria AlbeIva Saad Buhl, MD, Concha PyoFACEP      Liandro Thelin, MD 08/14/18 [phone]

## 2018-10-25 ENCOUNTER — Other Ambulatory Visit: Payer: Self-pay

## 2018-10-25 ENCOUNTER — Encounter (HOSPITAL_COMMUNITY): Payer: Self-pay

## 2018-10-25 ENCOUNTER — Emergency Department (HOSPITAL_COMMUNITY)
Admission: EM | Admit: 2018-10-25 | Discharge: 2018-10-26 | Disposition: A | Discharge status: Home / Self Care | Payer: Self-pay | Attending: Emergency Medicine | Admitting: Emergency Medicine

## 2018-10-25 DIAGNOSIS — R109 Unspecified abdominal pain: Secondary | ICD-10-CM | POA: Insufficient documentation

## 2018-10-25 DIAGNOSIS — F17200 Nicotine dependence, unspecified, uncomplicated: Secondary | ICD-10-CM | POA: Insufficient documentation

## 2018-10-25 DIAGNOSIS — Y909 Presence of alcohol in blood, level not specified: Secondary | ICD-10-CM | POA: Insufficient documentation

## 2018-10-25 DIAGNOSIS — F10920 Alcohol use, unspecified with intoxication, uncomplicated: Secondary | ICD-10-CM | POA: Insufficient documentation
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
## 2018-10-25 NOTE — ED Notes (Signed)
Bed: WA20 Expected date:  Expected time:  Means of arrival:  Comments: 35M sickle cell pain
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
## 2018-11-12 ENCOUNTER — Emergency Department (HOSPITAL_COMMUNITY)
Admission: EM | Admit: 2018-11-12 | Discharge: 2018-11-13 | Disposition: A | Discharge status: Home / Self Care | Payer: Self-pay | Attending: Emergency Medicine | Admitting: Emergency Medicine

## 2018-11-12 ENCOUNTER — Other Ambulatory Visit: Payer: Self-pay

## 2018-11-12 ENCOUNTER — Encounter (HOSPITAL_COMMUNITY): Payer: Self-pay | Admitting: Obstetrics and Gynecology

## 2018-11-12 DIAGNOSIS — F10920 Alcohol use, unspecified with intoxication, uncomplicated: Secondary | ICD-10-CM | POA: Insufficient documentation

## 2018-11-12 DIAGNOSIS — Z79899 Other long term (current) drug therapy: Secondary | ICD-10-CM | POA: Insufficient documentation

## 2018-11-12 DIAGNOSIS — F17210 Nicotine dependence, cigarettes, uncomplicated: Secondary | ICD-10-CM | POA: Insufficient documentation

## 2018-11-12 LAB — COMPREHENSIVE METABOLIC PANEL
ALT: 17 U/L (ref 0–44)
AST: 20 U/L (ref 15–41)
Albumin: 4.6 g/dL (ref 3.5–5.0)
Alkaline Phosphatase: 56 U/L (ref 38–126)
Anion gap: 13 (ref 5–15)
BUN: 9 mg/dL (ref 6–20)
CO2: 19 mmol/L — ABNORMAL LOW (ref 22–32)
Calcium: 8.8 mg/dL — ABNORMAL LOW (ref 8.9–10.3)
Chloride: 106 mmol/L (ref 98–111)
Creatinine, Ser: 0.92 mg/dL (ref 0.61–1.24)
GFR calc Af Amer: >60 mL/min (ref >60)
GFR calc non Af Amer: >60 mL/min (ref >60)
Glucose, Bld: 85 mg/dL (ref 70–99)
Potassium: 3.6 mmol/L (ref 3.5–5.1)
Sodium: 138 mmol/L (ref 135–145)
Total Bilirubin: 1.1 mg/dL (ref 0.3–1.2)
Total Protein: 7.1 g/dL (ref 6.5–8.1)

## 2018-11-12 LAB — CBC
HCT: 46.7 % (ref 39.0–52.0)
Hemoglobin: 15.3 g/dL (ref 13.0–17.0)
MCH: 28.5 pg (ref 26.0–34.0)
MCHC: 32.8 g/dL (ref 30.0–36.0)
MCV: 87 fL (ref 80.0–100.0)
Platelets: 302 10*3/uL (ref 150–400)
RBC: 5.37 MIL/uL (ref 4.22–5.81)
RDW: 13.9 % (ref 11.5–15.5)
WBC: 6.8 10*3/uL (ref 4.0–10.5)
nRBC: 0 % (ref 0.0–0.2)

## 2018-11-12 LAB — ETHANOL: Alcohol, Ethyl (B): 320 mg/dL (ref <10)

## 2018-11-12 LAB — RAPID URINE DRUG SCREEN, HOSP PERFORMED
Amphetamines: NOT DETECTED
Barbiturates: NOT DETECTED
Benzodiazepines: NOT DETECTED
Cocaine: NOT DETECTED
Opiates: NOT DETECTED
Tetrahydrocannabinol: NOT DETECTED

## 2018-11-12 NOTE — ED Notes (Signed)
Bed: [facility] Expected date:  Expected time:  Means of arrival:  Comments: [employer]
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
## 2018-11-12 NOTE — ED Notes (Signed)
Patient's belongings placed in cabinet "23-25 HALL C" One grey and pink bookbag with one pt belongings bag containing shoes and wet clothes PT's knife given to security.
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
## 2018-11-13 NOTE — ED Provider Notes (Signed)
I assumed care of this patient from Dr. Lynelle Doctor at 0000.  Please see their note for further details of Hx, PE.  Briefly patient is a 23 y.o. male who presented with alcohol intoxication.  Plan is to allow patient to metabolize to freedom and reassess  Patient awake and alert, tolerating oral intake.  Ambulated without complication.  Clinically sober and safe for discharge.  Disposition: Discharge  Condition: Good  I have discussed the results, Dx and Tx plan with the patient who expressed understanding and agree(s) with the plan. Discharge instructions discussed at great length. The patient was given strict return precautions who verbalized understanding of the instructions. No further questions at time of discharge.    ED Discharge Orders    None       Follow Up: Hilbert Corrigan Chales Abrahams, NP [address] [place] [address] [phone]  Schedule an appointment as soon as possible for a visit  As needed      Nira Conn, MD 11/13/18 [phone]

## 2018-11-13 NOTE — ED Notes (Signed)
Mittens removed  °

## 2019-02-22 ENCOUNTER — Encounter (HOSPITAL_COMMUNITY): Payer: Self-pay | Admitting: Emergency Medicine

## 2019-02-22 ENCOUNTER — Other Ambulatory Visit: Payer: Self-pay

## 2019-02-22 ENCOUNTER — Emergency Department (HOSPITAL_COMMUNITY)
Admission: EM | Admit: 2019-02-22 | Discharge: 2019-02-23 | Disposition: A | Discharge status: Home / Self Care | Payer: Self-pay | Attending: Emergency Medicine | Admitting: Emergency Medicine

## 2019-02-22 DIAGNOSIS — R739 Hyperglycemia, unspecified: Secondary | ICD-10-CM

## 2019-02-22 DIAGNOSIS — F17210 Nicotine dependence, cigarettes, uncomplicated: Secondary | ICD-10-CM | POA: Insufficient documentation

## 2019-02-22 DIAGNOSIS — F10129 Alcohol abuse with intoxication, unspecified: Secondary | ICD-10-CM | POA: Insufficient documentation

## 2019-02-22 DIAGNOSIS — Y908 Blood alcohol level of 240 mg/100 ml or more: Secondary | ICD-10-CM | POA: Insufficient documentation

## 2019-02-22 DIAGNOSIS — F10920 Alcohol use, unspecified with intoxication, uncomplicated: Secondary | ICD-10-CM

## 2019-02-22 LAB — COMPREHENSIVE METABOLIC PANEL
ALT: 18 U/L (ref 0–44)
AST: 21 U/L (ref 15–41)
Albumin: 4.6 g/dL (ref 3.5–5.0)
Alkaline Phosphatase: 72 U/L (ref 38–126)
Anion gap: 12 (ref 5–15)
BUN: 9 mg/dL (ref 6–20)
CO2: 22 mmol/L (ref 22–32)
Calcium: 8.7 mg/dL — ABNORMAL LOW (ref 8.9–10.3)
Chloride: 108 mmol/L (ref 98–111)
Creatinine, Ser: 1.13 mg/dL (ref 0.61–1.24)
GFR calc Af Amer: >60 mL/min (ref >60)
GFR calc non Af Amer: >60 mL/min (ref >60)
Glucose, Bld: 165 mg/dL — ABNORMAL HIGH (ref 70–99)
Potassium: 3.5 mmol/L (ref 3.5–5.1)
Sodium: 142 mmol/L (ref 135–145)
Total Bilirubin: 0.8 mg/dL (ref 0.3–1.2)
Total Protein: 7.7 g/dL (ref 6.5–8.1)

## 2019-02-22 LAB — CBC WITH DIFFERENTIAL/PLATELET
Abs Immature Granulocytes: 0.05 10*3/uL (ref 0.00–0.07)
Basophils Absolute: 0.1 10*3/uL (ref 0.0–0.1)
Basophils Relative: 1 %
Eosinophils Absolute: 0.1 10*3/uL (ref 0.0–0.5)
Eosinophils Relative: 1 %
HCT: 47.5 % (ref 39.0–52.0)
Hemoglobin: 15.4 g/dL (ref 13.0–17.0)
Immature Granulocytes: 1 %
Lymphocytes Relative: 26 %
Lymphs Abs: 2.4 10*3/uL (ref 0.7–4.0)
MCH: 28.3 pg (ref 26.0–34.0)
MCHC: 32.4 g/dL (ref 30.0–36.0)
MCV: 87.3 fL (ref 80.0–100.0)
Monocytes Absolute: 0.5 10*3/uL (ref 0.1–1.0)
Monocytes Relative: 6 %
Neutro Abs: 5.9 10*3/uL (ref 1.7–7.7)
Neutrophils Relative %: 65 %
Platelets: 301 10*3/uL (ref 150–400)
RBC: 5.44 MIL/uL (ref 4.22–5.81)
RDW: 13.5 % (ref 11.5–15.5)
WBC: 9 10*3/uL (ref 4.0–10.5)
nRBC: 0 % (ref 0.0–0.2)

## 2019-02-22 LAB — RAPID URINE DRUG SCREEN, HOSP PERFORMED
Amphetamines: NOT DETECTED
Barbiturates: NOT DETECTED
Benzodiazepines: NOT DETECTED
Cocaine: NOT DETECTED
Opiates: NOT DETECTED
Tetrahydrocannabinol: NOT DETECTED

## 2019-02-22 LAB — ETHANOL: Alcohol, Ethyl (B): 317 mg/dL (ref <10)

## 2019-02-22 NOTE — ED Provider Notes (Signed)
[facility]-EMERGENCY DEPT Provider Note   CSN: [id number] Arrival date & time: 02/22/19  1654   History   Chief Complaint Chief Complaint  Patient presents with  . Alcohol Intoxication   HPI Guy [name] is a 23 y.o. male with past medical history significant for bipolar disorder, alcohol abuse who presents for evaluation of alcohol intoxication.  [facility].  He was accused of theft and when police came they gave him the option of hospital or jail.  Patient was stumbling and slurring his words at that time.  Was last seen in May for similar symptoms.  Urinated on himself.  Patient's only complaint is that he is hungry.  States he drinks alcohol daily however is unable to quantify.  He denies any drug use. He denies any other complaints including fever, headache, chest pain, shortness of breath, abdominal pain, vomiting, diarrhea, weakness.  No identified trauma or recent injury.  History obtained from patient and past medical records.  No interpreter was used.     HPI  Past Medical History:  Diagnosis Date  . ADHD (attention deficit hyperactivity disorder)   . Bipolar 1 disorder (HCC)   . Depression   . Migraine   . PTSD (post-traumatic stress disorder)     Patient Active Problem List   Diagnosis Date Noted  . Chest wall pain 05/31/2016  . Fever 05/28/2016  . Adjustment disorder with depressed mood 04/25/2016  . Attention deficit hyperactivity disorder (ADHD) 04/25/2016    Past Surgical History:  Procedure Laterality Date  . KNEE SURGERY    . WISDOM TOOTH EXTRACTION          Home Medications    Prior to Admission medications   Medication Sig Start Date End Date Taking? Authorizing Provider  chlordiazePOXIDE (LIBRIUM) 25 MG capsule 50mg  PO TID x 1D, then 25-50mg  PO BID X 1D, then 25-50mg  PO QD X 1D Patient not taking: Reported on 10/25/2018 08/14/18   Pricilla LovelessGoldston, Scott, MD  clindamycin  (CLEOCIN) 150 MG capsule Take 3 capsules (450 mg total) by mouth 3 (three) times daily. Patient not taking: Reported on 10/25/2018 01/01/18   Muthersbaugh, Dahlia ClientHannah, PA-C    Family History Family History  Problem Relation Age of Onset  . Sickle cell trait Father     Social History Social History   Tobacco Use  . Smoking status: Current Every Day Smoker    Packs/day: 1.00  . Smokeless tobacco: Never Used  Substance Use Topics  . Alcohol use: Yes  . Drug use: Yes    Types: Marijuana     Allergies   Shellfish allergy   Review of Systems Review of Systems  Unable to perform ROS: Other (Intoxication)  Constitutional: Negative.   HENT: Negative.   Respiratory: Negative.   Cardiovascular: Negative.   Gastrointestinal: Negative.   Genitourinary: Negative.   Musculoskeletal: Negative.   Skin: Negative.   Neurological: Negative.   All other systems reviewed and are negative.   Physical Exam Updated Vital Signs BP 119/66 (BP Location: Right Arm)   Pulse 86   Temp 98 F (36.7 C) (Oral)   Resp 18   SpO2 98%   Physical Exam Vitals signs and nursing note reviewed.  Constitutional:      General: He is not in acute distress.    Appearance: He is well-developed. He is not ill-appearing, toxic-appearing or diaphoretic.     Comments: Sleeping on exam room floor, had urinated on himself  and the floor.  Patient slurring his words.  Appears disheveled and intoxicated.  HENT:     Head: Normocephalic and atraumatic.     Mouth/Throat:     Mouth: Mucous membranes are moist.  Eyes:     Pupils: Pupils are equal, round, and reactive to light.  Neck:     Musculoskeletal: Normal range of motion and neck supple.  Cardiovascular:     Rate and Rhythm: Normal rate and regular rhythm.  Pulmonary:     Effort: Pulmonary effort is normal. No respiratory distress.     Breath sounds: Normal breath sounds.  Abdominal:     General: Bowel sounds are normal. There is no distension.      Palpations: Abdomen is soft.  Musculoskeletal: Normal range of motion.     Comments: Ankle monitor to right ankle.  All 4 extremities without difficulty.  Patient ambulated from 4 to chair without difficulty.  Skin:    General: Skin is warm and dry.  Neurological:     Mental Status: He is alert.     Comments: Difficulty with gait.  Cranial nerves II through XII grossly intact.  Slurring speech.    ED Treatments / Results  Labs (all labs ordered are listed, but only abnormal results are displayed) Labs Reviewed  COMPREHENSIVE METABOLIC PANEL - Abnormal; Notable for the following components:      Result Value   Glucose, Bld 165 (*)    Calcium 8.7 (*)    All other components within normal limits  ETHANOL - Abnormal; Notable for the following components:   Alcohol, Ethyl (B) 317 (*)    All other components within normal limits  CBC WITH DIFFERENTIAL/PLATELET  RAPID URINE DRUG SCREEN, HOSP PERFORMED    EKG None  Radiology No results found.  Procedures Procedures (including critical care time)  Medications Ordered in ED Medications - No data to display   Initial Impression / Assessment and Plan / ED Course  I have reviewed the triage vital signs and the nursing notes.  Pertinent labs & imaging results that were available during my care of the patient were reviewed by me and considered in my medical decision making (see chart for details).  40 male brought in by EMS for intoxication.  Has history of similar.  Patient slurring words, sleeping on exam on floor and had urinated on himself.  Admits to chronic alcohol abuse however is unable to quantify.  Denies drug use.  Patient appears intoxicated however cooperative on exam.  He moves all 4 extremities without difficulty and with coordination.  I have low suspicion for acute neurologic event.  No history of trauma or injury.  No complaints initial evaluation. Will obtain labs, allow him to sober. Denies SI. HI, AVH.  Called  into from nursing.  Patient requesting food. Will allow PO intake.  Sleeping on reevaluation.  Labs personally reviewed CBC without leukocytosis, metabolic panel with mild elevation in glucose, no additional joint, renal liver abnormality Ethanol 317 UDS negative.  0030: Patient continues to be sleepy however arrousable by voice. No complaints current stating he is "sleepy"  Plan to metabolize to freedom and reassess. Care transferred to [place] PA-C.      Final Clinical Impressions(s) / ED Diagnoses   Final diagnoses:  Alcoholic intoxication without complication ([place])  Blood glucose elevated    ED Discharge Orders    None       Henderly, Britni A, PA-C 02/23/19 0102    Daleen Bo, MD 02/25/19 [phone]

## 2019-02-22 NOTE — ED Notes (Signed)
ED Provider at bedside. 

## 2019-02-22 NOTE — ED Notes (Addendum)
Pt given a new pair of socks and blankets due to the other ones "being very wet".  Pt given graham crackers and Sprite and informed that he can't have a 3rd sandwich at this time.

## 2019-02-22 NOTE — ED Notes (Signed)
Pt repeatedly keeps coming out of triage room to desk asking for food. Pt constantly redirected to triage room and informed that until patient seen by EDP we aren't able to get him anything to eat.

## 2019-02-22 NOTE — ED Notes (Signed)
Pt given a second sandwich.

## 2019-02-22 NOTE — ED Notes (Signed)
PA states that patient a sleep on the floor covered in urine. Pt not talking to PA.  This RN and Dylan EMT wen to triage room to see patient. Pt not wanting to talk to staff. This RN informed patient that we dont have tuna but we have [place] or ham sandwiches that he can have once he talks to the provider.  Pt opens eyes and states "That's fine". Pt got out of floor and into triage stretcher chair and talking to provider.  EMT gave patient Sprite, sandwich and blanket.
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
## 2019-02-22 NOTE — ED Notes (Signed)
Pt asking for another sandwich. Pt informed that he was already given 2 sandwiches and cant have anymore at this time. Pt offered a fruit cup and crackers but declined.

## 2019-02-22 NOTE — ED Notes (Signed)
CRITICAL VALUE STICKER  CRITICAL VALUE: ETOH 317  RECEIVER (on-site recipient of call): Jake T RN  DATE & TIME NOTIFIED: 02/22/19 831p  MESSENGER (representative from lab): Seward Speck  PROVIDER NOTIFIED: Britni H PA  TIME OF NOTIFICATION: 831p  RESPONSE: continue to monitor
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
## 2019-02-23 NOTE — Discharge Instructions (Addendum)
1. Medications: usual home medications 2. Treatment: rest, drink plenty of fluids,  3. Follow Up: Please followup with your primary doctor in 2-3 days for discussion of your diagnoses and further evaluation after today's visit; if you do not have a primary care doctor use the resource guide provided to find one; Please return to the ER for new or worsening symptoms  

## 2019-02-23 NOTE — ED Notes (Signed)
Pt able to tolerate PO fluids without emesis.  

## 2019-04-04 ENCOUNTER — Other Ambulatory Visit: Payer: Self-pay

## 2019-04-04 ENCOUNTER — Encounter (HOSPITAL_COMMUNITY): Payer: Self-pay | Admitting: Emergency Medicine

## 2019-04-04 ENCOUNTER — Emergency Department (HOSPITAL_COMMUNITY)
Admission: EM | Admit: 2019-04-04 | Discharge: 2019-04-04 | Disposition: A | Discharge status: Home / Self Care | Payer: Self-pay | Attending: Emergency Medicine | Admitting: Emergency Medicine

## 2019-04-04 DIAGNOSIS — S0993XA Unspecified injury of face, initial encounter: Secondary | ICD-10-CM | POA: Insufficient documentation

## 2019-04-04 DIAGNOSIS — Y999 Unspecified external cause status: Secondary | ICD-10-CM | POA: Insufficient documentation

## 2019-04-04 DIAGNOSIS — Y939 Activity, unspecified: Secondary | ICD-10-CM | POA: Insufficient documentation

## 2019-04-04 DIAGNOSIS — Z5321 Procedure and treatment not carried out due to patient leaving prior to being seen by health care provider: Secondary | ICD-10-CM | POA: Insufficient documentation

## 2019-04-04 DIAGNOSIS — Y929 Unspecified place or not applicable: Secondary | ICD-10-CM | POA: Insufficient documentation
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
## 2019-04-04 NOTE — ED Notes (Signed)
Called x 2 for triage, no answer 

## 2019-04-04 NOTE — ED Notes (Signed)
Pt given two warm blankets.  

## 2019-04-04 NOTE — ED Notes (Signed)
Pt outside with family.  

## 2019-04-05 ENCOUNTER — Encounter (HOSPITAL_COMMUNITY): Payer: Self-pay | Admitting: Emergency Medicine

## 2019-04-05 ENCOUNTER — Emergency Department (HOSPITAL_COMMUNITY): Payer: Self-pay

## 2019-04-05 ENCOUNTER — Other Ambulatory Visit: Payer: Self-pay

## 2019-04-05 ENCOUNTER — Emergency Department (HOSPITAL_COMMUNITY)
Admission: EM | Admit: 2019-04-05 | Discharge: 2019-04-05 | Disposition: A | Discharge status: Home / Self Care | Payer: Self-pay | Attending: Emergency Medicine | Admitting: Emergency Medicine

## 2019-04-05 DIAGNOSIS — Z23 Encounter for immunization: Secondary | ICD-10-CM | POA: Insufficient documentation

## 2019-04-05 DIAGNOSIS — S80212A Abrasion, left knee, initial encounter: Secondary | ICD-10-CM | POA: Insufficient documentation

## 2019-04-05 DIAGNOSIS — Y999 Unspecified external cause status: Secondary | ICD-10-CM | POA: Insufficient documentation

## 2019-04-05 DIAGNOSIS — S0081XA Abrasion of other part of head, initial encounter: Secondary | ICD-10-CM | POA: Insufficient documentation

## 2019-04-05 DIAGNOSIS — F17210 Nicotine dependence, cigarettes, uncomplicated: Secondary | ICD-10-CM | POA: Insufficient documentation

## 2019-04-05 DIAGNOSIS — T07XXXA Unspecified multiple injuries, initial encounter: Secondary | ICD-10-CM

## 2019-04-05 DIAGNOSIS — Y92480 Sidewalk as the place of occurrence of the external cause: Secondary | ICD-10-CM | POA: Insufficient documentation

## 2019-04-05 DIAGNOSIS — Y9301 Activity, walking, marching and hiking: Secondary | ICD-10-CM | POA: Insufficient documentation

## 2019-04-05 MED ORDER — ACETAMINOPHEN 500 MG PO TABS
1000.0000 mg | ORAL_TABLET | Freq: Once | ORAL | Status: AC
  Administered 2019-04-05: 1000 mg via ORAL
  Filled 2019-04-05: qty 2

## 2019-04-05 MED ORDER — BACITRACIN ZINC 500 UNIT/GM EX OINT
TOPICAL_OINTMENT | Freq: Two times a day (BID) | CUTANEOUS | Status: DC
  Administered 2019-04-05: 2 via TOPICAL
  Filled 2019-04-05: qty 1.8

## 2019-04-05 MED ORDER — TETANUS-DIPHTH-ACELL PERTUSSIS 5-2.5-18.5 LF-MCG/0.5 IM SUSP
0.5000 mL | Freq: Once | INTRAMUSCULAR | Status: AC
  Administered 2019-04-05: 15:00:00 0.5 mL via INTRAMUSCULAR
  Filled 2019-04-05: qty 0.5
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
## 2019-04-05 NOTE — ED Notes (Signed)
RN cleaned patient's wounds, unable to clean patient's face as it has already scabbed. Blond hairs pulled from patient's wounds on face.  RN cleaned and applied bacitracin to wounds on left knee and left wrist. Areas wrapped
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
## 2019-05-09 ENCOUNTER — Encounter (HOSPITAL_COMMUNITY): Payer: Self-pay | Admitting: Emergency Medicine

## 2019-05-09 ENCOUNTER — Other Ambulatory Visit: Payer: Self-pay

## 2019-05-09 ENCOUNTER — Emergency Department (HOSPITAL_COMMUNITY)
Admission: EM | Admit: 2019-05-09 | Discharge: 2019-05-10 | Disposition: A | Discharge status: Home / Self Care | Payer: Self-pay | Attending: Emergency Medicine | Admitting: Emergency Medicine

## 2019-05-09 DIAGNOSIS — M546 Pain in thoracic spine: Secondary | ICD-10-CM | POA: Diagnosis present

## 2019-05-09 DIAGNOSIS — Y9301 Activity, walking, marching and hiking: Secondary | ICD-10-CM | POA: Insufficient documentation

## 2019-05-09 DIAGNOSIS — Y999 Unspecified external cause status: Secondary | ICD-10-CM | POA: Insufficient documentation

## 2019-05-09 DIAGNOSIS — Y92410 Unspecified street and highway as the place of occurrence of the external cause: Secondary | ICD-10-CM | POA: Diagnosis not present
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
## 2019-05-10 ENCOUNTER — Emergency Department (HOSPITAL_COMMUNITY): Payer: Self-pay

## 2019-05-10 MED ORDER — ACETAMINOPHEN 500 MG PO TABS
1000.0000 mg | ORAL_TABLET | Freq: Once | ORAL | Status: AC
  Administered 2019-05-10: 1000 mg via ORAL
  Filled 2019-05-10: qty 2
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
## 2019-05-10 NOTE — Discharge Instructions (Addendum)
Your x-rays today are normal.  You can take Motrin and Tylenol as needed as directed for pain.  Follow-up with your doctor if pain continues.
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
## 2019-05-22 ENCOUNTER — Other Ambulatory Visit: Payer: Self-pay

## 2019-05-22 ENCOUNTER — Emergency Department (HOSPITAL_COMMUNITY)
Admission: EM | Admit: 2019-05-22 | Discharge: 2019-05-22 | Disposition: A | Discharge status: Home / Self Care | Payer: Self-pay | Attending: Emergency Medicine | Admitting: Emergency Medicine

## 2019-05-22 NOTE — ED Notes (Signed)
Patient got off stretcher and stated he was leaving and asked how to get out.

## 2019-06-17 ENCOUNTER — Encounter (HOSPITAL_COMMUNITY): Payer: Self-pay | Admitting: Emergency Medicine

## 2019-06-17 ENCOUNTER — Emergency Department (HOSPITAL_COMMUNITY): Payer: Self-pay

## 2019-06-17 ENCOUNTER — Emergency Department (HOSPITAL_COMMUNITY)
Admission: EM | Admit: 2019-06-17 | Discharge: 2019-06-17 | Disposition: A | Discharge status: Home / Self Care | Payer: Self-pay | Attending: Emergency Medicine | Admitting: Emergency Medicine

## 2019-06-17 DIAGNOSIS — F17200 Nicotine dependence, unspecified, uncomplicated: Secondary | ICD-10-CM | POA: Insufficient documentation

## 2019-06-17 DIAGNOSIS — D571 Sickle-cell disease without crisis: Secondary | ICD-10-CM

## 2019-06-17 DIAGNOSIS — Z79899 Other long term (current) drug therapy: Secondary | ICD-10-CM | POA: Insufficient documentation

## 2019-06-17 DIAGNOSIS — Z20828 Contact with and (suspected) exposure to other viral communicable diseases: Secondary | ICD-10-CM | POA: Insufficient documentation

## 2019-06-17 DIAGNOSIS — F10920 Alcohol use, unspecified with intoxication, uncomplicated: Secondary | ICD-10-CM

## 2019-06-17 LAB — CBC WITH DIFFERENTIAL/PLATELET
Abs Immature Granulocytes: 0.02 10*3/uL (ref 0.00–0.07)
Basophils Absolute: 0 10*3/uL (ref 0.0–0.1)
Basophils Relative: 1 %
Eosinophils Absolute: 0 10*3/uL (ref 0.0–0.5)
Eosinophils Relative: 0 %
HCT: 56.9 % — ABNORMAL HIGH (ref 39.0–52.0)
Hemoglobin: 18.7 g/dL — ABNORMAL HIGH (ref 13.0–17.0)
Immature Granulocytes: 0 %
Lymphocytes Relative: 42 %
Lymphs Abs: 2.8 10*3/uL (ref 0.7–4.0)
MCH: 28.6 pg (ref 26.0–34.0)
MCHC: 32.9 g/dL (ref 30.0–36.0)
MCV: 87 fL (ref 80.0–100.0)
Monocytes Absolute: 0.4 10*3/uL (ref 0.1–1.0)
Monocytes Relative: 7 %
Neutro Abs: 3.4 10*3/uL (ref 1.7–7.7)
Neutrophils Relative %: 50 %
Platelets: 339 10*3/uL (ref 150–400)
RBC: 6.54 MIL/uL — ABNORMAL HIGH (ref 4.22–5.81)
RDW: 14.2 % (ref 11.5–15.5)
WBC: 6.7 10*3/uL (ref 4.0–10.5)
nRBC: 0 % (ref 0.0–0.2)

## 2019-06-17 LAB — RETICULOCYTES
Immature Retic Fract: 6 % (ref 2.3–15.9)
RBC.: 6.54 MIL/uL — ABNORMAL HIGH (ref 4.22–5.81)
Retic Count, Absolute: 45.1 10*3/uL (ref 19.0–186.0)
Retic Ct Pct: 0.7 % (ref 0.4–3.1)

## 2019-06-17 LAB — BASIC METABOLIC PANEL
Anion gap: 15 (ref 5–15)
BUN: 7 mg/dL (ref 6–20)
CO2: 28 mmol/L (ref 22–32)
Calcium: 9 mg/dL (ref 8.9–10.3)
Chloride: 98 mmol/L (ref 98–111)
Creatinine, Ser: 0.98 mg/dL (ref 0.61–1.24)
GFR calc Af Amer: >60 mL/min (ref >60)
GFR calc non Af Amer: >60 mL/min (ref >60)
Glucose, Bld: 102 mg/dL — ABNORMAL HIGH (ref 70–99)
Potassium: 4.4 mmol/L (ref 3.5–5.1)
Sodium: 141 mmol/L (ref 135–145)

## 2019-06-17 LAB — POC SARS CORONAVIRUS 2 AG -  ED: SARS Coronavirus 2 Ag: NEGATIVE

## 2019-06-17 LAB — D-DIMER, QUANTITATIVE: D-Dimer, Quant: <0.27 ug/mL-FEU (ref 0.00–0.50)

## 2019-06-17 NOTE — Progress Notes (Signed)
Per chart, pt left.  CSW attempted to call the pt to offer resources but pt did not answer and VM was not set up.  CSW will continue to follow for D/C needs.  Guy [name]. Timika Muench, LCSW, LCAS, CSI Transitions of Care Clinical Social Worker Care Coordination Department Ph: [phone]

## 2019-06-17 NOTE — ED Triage Notes (Signed)
Patient is unwilling to answer triage questions, vitals, EKG at this time
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
## 2019-06-17 NOTE — ED Notes (Signed)
Writer spoke with Dr Alvino Chapel who states pt can be discharged

## 2019-06-18 LAB — NOVEL CORONAVIRUS, NAA (HOSP ORDER, SEND-OUT TO REF LAB; TAT 18-24 HRS): SARS-CoV-2, NAA: NOT DETECTED

## 2019-07-12 ENCOUNTER — Other Ambulatory Visit: Payer: Self-pay | Admitting: *Deleted

## 2019-07-12 DIAGNOSIS — Z20822 Contact with and (suspected) exposure to covid-19: Secondary | ICD-10-CM

## 2019-07-13 LAB — NOVEL CORONAVIRUS, NAA: SARS-CoV-2, NAA: NOT DETECTED

## 2019-07-23 ENCOUNTER — Emergency Department (HOSPITAL_COMMUNITY)
Admission: EM | Admit: 2019-07-23 | Discharge: 2019-07-23 | Disposition: A | Discharge status: Home / Self Care | Payer: Self-pay | Attending: Emergency Medicine | Admitting: Emergency Medicine

## 2019-07-23 DIAGNOSIS — R197 Diarrhea, unspecified: Secondary | ICD-10-CM | POA: Insufficient documentation

## 2019-07-23 DIAGNOSIS — F909 Attention-deficit hyperactivity disorder, unspecified type: Secondary | ICD-10-CM | POA: Insufficient documentation

## 2019-07-23 DIAGNOSIS — F17210 Nicotine dependence, cigarettes, uncomplicated: Secondary | ICD-10-CM | POA: Insufficient documentation

## 2019-07-23 DIAGNOSIS — R112 Nausea with vomiting, unspecified: Secondary | ICD-10-CM | POA: Insufficient documentation

## 2019-07-23 DIAGNOSIS — Z79899 Other long term (current) drug therapy: Secondary | ICD-10-CM | POA: Insufficient documentation

## 2019-07-23 DIAGNOSIS — R1084 Generalized abdominal pain: Secondary | ICD-10-CM | POA: Insufficient documentation

## 2019-07-23 DIAGNOSIS — D573 Sickle-cell trait: Secondary | ICD-10-CM | POA: Insufficient documentation

## 2019-07-23 LAB — BLOOD GAS, VENOUS
Acid-Base Excess: 0.3 mmol/L (ref 0.0–2.0)
Bicarbonate: 24.9 mmol/L (ref 20.0–28.0)
O2 Saturation: 91.5 %
Patient temperature: 98.6
pCO2, Ven: 42.6 mmHg — ABNORMAL LOW (ref 44.0–60.0)
pH, Ven: 7.386 (ref 7.250–7.430)
pO2, Ven: 64 mmHg — ABNORMAL HIGH (ref 32.0–45.0)

## 2019-07-23 LAB — CBC WITH DIFFERENTIAL/PLATELET
Abs Immature Granulocytes: 0.02 10*3/uL (ref 0.00–0.07)
Basophils Absolute: 0.1 10*3/uL (ref 0.0–0.1)
Basophils Relative: 1 %
Eosinophils Absolute: 1.1 10*3/uL — ABNORMAL HIGH (ref 0.0–0.5)
Eosinophils Relative: 13 %
HCT: 51.6 % (ref 39.0–52.0)
Hemoglobin: 17 g/dL (ref 13.0–17.0)
Immature Granulocytes: 0 %
Lymphocytes Relative: 7 %
Lymphs Abs: 0.6 10*3/uL — ABNORMAL LOW (ref 0.7–4.0)
MCH: 28.7 pg (ref 26.0–34.0)
MCHC: 32.9 g/dL (ref 30.0–36.0)
MCV: 87.2 fL (ref 80.0–100.0)
Monocytes Absolute: 0.4 10*3/uL (ref 0.1–1.0)
Monocytes Relative: 5 %
Neutro Abs: 6.3 10*3/uL (ref 1.7–7.7)
Neutrophils Relative %: 74 %
Platelets: 306 10*3/uL (ref 150–400)
RBC: 5.92 MIL/uL — ABNORMAL HIGH (ref 4.22–5.81)
RDW: 13.7 % (ref 11.5–15.5)
WBC: 8.5 10*3/uL (ref 4.0–10.5)
nRBC: 0 % (ref 0.0–0.2)

## 2019-07-23 LAB — LIPASE, BLOOD: Lipase: 18 U/L (ref 11–51)

## 2019-07-23 LAB — URINALYSIS, ROUTINE W REFLEX MICROSCOPIC
Bacteria, UA: NONE SEEN
Bilirubin Urine: NEGATIVE
Glucose, UA: 50 mg/dL — AB
Ketones, ur: 80 mg/dL — AB
Leukocytes,Ua: NEGATIVE
Nitrite: NEGATIVE
Protein, ur: 100 mg/dL — AB
Specific Gravity, Urine: 1.023 (ref 1.005–1.030)
pH: 5 (ref 5.0–8.0)

## 2019-07-23 LAB — COMPREHENSIVE METABOLIC PANEL
ALT: 30 U/L (ref 0–44)
AST: 52 U/L — ABNORMAL HIGH (ref 15–41)
Albumin: 5.3 g/dL — ABNORMAL HIGH (ref 3.5–5.0)
Alkaline Phosphatase: 73 U/L (ref 38–126)
Anion gap: 21 — ABNORMAL HIGH (ref 5–15)
BUN: 16 mg/dL (ref 6–20)
CO2: 21 mmol/L — ABNORMAL LOW (ref 22–32)
Calcium: 9.4 mg/dL (ref 8.9–10.3)
Chloride: 98 mmol/L (ref 98–111)
Creatinine, Ser: 1.17 mg/dL (ref 0.61–1.24)
GFR calc Af Amer: >60 mL/min (ref >60)
GFR calc non Af Amer: >60 mL/min (ref >60)
Glucose, Bld: 81 mg/dL (ref 70–99)
Potassium: 5 mmol/L (ref 3.5–5.1)
Sodium: 140 mmol/L (ref 135–145)
Total Bilirubin: 2.3 mg/dL — ABNORMAL HIGH (ref 0.3–1.2)
Total Protein: 8.7 g/dL — ABNORMAL HIGH (ref 6.5–8.1)

## 2019-07-23 LAB — RETICULOCYTES
Immature Retic Fract: 6.3 % (ref 2.3–15.9)
RBC.: 5.87 MIL/uL — ABNORMAL HIGH (ref 4.22–5.81)
Retic Count, Absolute: 37 10*3/uL (ref 19.0–186.0)
Retic Ct Pct: 0.6 % (ref 0.4–3.1)

## 2019-07-23 MED ORDER — ONDANSETRON HCL 4 MG/2ML IJ SOLN
4.0000 mg | Freq: Once | INTRAMUSCULAR | Status: AC
  Administered 2019-07-23: 4 mg via INTRAVENOUS
  Filled 2019-07-23: qty 2

## 2019-07-23 MED ORDER — ONDANSETRON 4 MG PO TBDP: 4.0000 mg | ORAL_TABLET | Freq: Three times a day (TID) | ORAL | 0 refills | Status: DC | PRN

## 2019-07-23 MED ORDER — SODIUM CHLORIDE 0.9 % IV SOLN: Freq: Once | INTRAVENOUS | Status: AC

## 2019-07-23 MED ORDER — FAMOTIDINE IN NACL 20-0.9 MG/50ML-% IV SOLN
20.0000 mg | Freq: Once | INTRAVENOUS | Status: AC
  Administered 2019-07-23: 20 mg via INTRAVENOUS
  Filled 2019-07-23: qty 50

## 2019-07-23 MED ORDER — ALUM & MAG HYDROXIDE-SIMETH 200-200-20 MG/5ML PO SUSP
30.0000 mL | Freq: Once | ORAL | Status: AC
  Administered 2019-07-23: 11:00:00 30 mL via ORAL
  Filled 2019-07-23: qty 30

## 2019-07-23 MED ORDER — LIDOCAINE VISCOUS HCL 2 % MT SOLN
15.0000 mL | Freq: Once | OROMUCOSAL | Status: AC
  Administered 2019-07-23: 15 mL via ORAL
  Filled 2019-07-23: qty 15

## 2019-07-23 NOTE — ED Triage Notes (Addendum)
Transported by [facility], patient is homeless. Has not had anything to eat or drink x 2 days. Hx of sickle cell per patient. VSS with EMS. CBG 91 mg/dl. Pt ambulatory. +generalized abdominal pain

## 2019-07-23 NOTE — Discharge Instructions (Addendum)
Your lab work was reassuring today.  Because, as you described you have vomited some blood.  I recommend that you follow-up with gastroenterology.  I have provided you with their phone number.

## 2019-07-23 NOTE — ED Notes (Signed)
Patient ambulatory to the restroom without assistance at this time.
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
## 2019-07-23 NOTE — ED Notes (Signed)
Pt given ginger ale at this time for fluid challenge.  

## 2019-07-23 NOTE — Progress Notes (Signed)
CSW received a call from pt's RN that the pt's [place] transport has not arrived to pick up the pt.  CSW calling contacts to seek answers on the transport now.  CSW will continue to follow for D/C needs.  Dorothe Pea. Markeeta Scalf, LCSW, LCAS, CSI Transitions of Care Clinical Social Worker Care Coordination Department Ph: [phone]

## 2019-07-23 NOTE — Progress Notes (Signed)
CSW spoke with [facility] who reports patient can go to [facility] in [place]. Because patient was not tested for COVID in the hospital, patient will go the quarantine hotel to be tested and once results come back negative patient can go to [facility]. Patient signed needed paperwork and paperwork was received at [facility]. Patient will be transported by the Health Department. [facility] to call with an ETA on transport.   Geralyn Corwin, LCSW Transitions of Care Department [facility] ED [phone]

## 2019-07-23 NOTE — ED Notes (Signed)
The ride for pt has not arrived. This RN reached out to SW to follow up on status and how to proceed. Awaiting response.

## 2019-07-23 NOTE — ED Notes (Signed)
Social work at bedside.  

## 2019-07-23 NOTE — ED Notes (Signed)
Social work advise waiting on health department to arrange pick up for pt to be transported to [facility].

## 2019-07-23 NOTE — ED Notes (Signed)
Charge nurse made aware of pt status and SW follow up/consult for transportation.

## 2019-07-23 NOTE — Progress Notes (Signed)
CSW received a call from [place] with [facility] who stated that [facility] transportation is scheduled to arrive today at 6:30 pm to pick up the pt and a quarantine hotel, tested for COVID and then pt will go from the hotel once quarantine is completed (likely Thurs 1/28) to [facility].  RN updated.  CSW will continue to follow for D/C needs.  Dorothe Pea. Khali Albanese, LCSW, LCAS, CSI Transitions of Care Clinical Social Worker Care Coordination Department Ph: [phone]

## 2019-07-23 NOTE — ED Provider Notes (Signed)
[place] DEPT Provider Note   CSN: [id number] Arrival date & time: 07/23/19  4098     History Chief Complaint  Patient presents with  . Abdominal Pain    Guy [name] is a 24 y.o. adult.  HPI  Patient is a 24 year old male (self identified as male during encounter) with history of sickle cell trait, PTSD, depression, homelessness presented today with generalized abdominal pain that is severe, 9/10, constant with no known aggravating or mitigating factors.  Patient endorses associated nausea generalized abdominal pain and diarrhea with one episode of vomiting that occurred this morning with documented dark red blood.  Patient has any fevers, unusual foods, new medications, states on no medications currently.  Denies any pelvic pain, penile discharge, concern for sexually transmitted diseases, trauma.     Past Medical History:  Diagnosis Date  . ADHD (attention deficit hyperactivity disorder)   . Bipolar 1 disorder ([place])   . Depression   . Migraine   . PTSD (post-traumatic stress disorder)   . Sickle cell anemia [facility])     Patient Active Problem List   Diagnosis Date Noted  . Chest wall pain 05/31/2016  . Fever 05/28/2016  . Adjustment disorder with depressed mood 04/25/2016  . Attention deficit hyperactivity disorder (ADHD) 04/25/2016    Past Surgical History:  Procedure Laterality Date  . KNEE SURGERY    . WISDOM TOOTH EXTRACTION         Family History  Problem Relation Age of Onset  . Sickle cell trait Father     Social History   Tobacco Use  . Smoking status: Current Every Day Smoker    Packs/day: 1.00  . Smokeless tobacco: Never Used  Substance Use Topics  . Alcohol use: Yes  . Drug use: Yes    Types: Marijuana    Home Medications Prior to Admission medications   Medication Sig Start Date End Date Taking? Authorizing Provider  chlordiazePOXIDE (LIBRIUM) 25 MG capsule 50mg  PO TID x 1D, then 25-50mg  PO BID X 1D,  then 25-50mg  PO QD X 1D Patient not taking: Reported on 10/25/2018 08/14/18   Sherwood Gambler, MD  clindamycin (CLEOCIN) 150 MG capsule Take 3 capsules (450 mg total) by mouth 3 (three) times daily. Patient not taking: Reported on 10/25/2018 01/01/18   Muthersbaugh, Jarrett Soho, PA-C  ondansetron (ZOFRAN ODT) 4 MG disintegrating tablet Take 1 tablet (4 mg total) by mouth every 8 (eight) hours as needed for nausea or vomiting. 07/23/19   Tedd Sias, PA    Allergies    Shellfish allergy  Review of Systems   Review of Systems  Constitutional: Positive for chills. Negative for fever.  HENT: Negative for congestion.   Eyes: Negative for pain.  Respiratory: Negative for cough and shortness of breath.   Cardiovascular: Negative for chest pain and leg swelling.  Gastrointestinal: Positive for diarrhea, nausea and vomiting (vomited blood). Negative for abdominal pain.  Genitourinary: Negative for dysuria.  Musculoskeletal: Negative for myalgias.  Skin: Negative for rash.  Neurological: Negative for dizziness and headaches.    Physical Exam Updated Vital Signs BP (!) 142/81   Pulse 93   Temp 98.4 F (36.9 C) (Oral)   Resp (!) 22   SpO2 98%   Physical Exam Vitals and nursing note reviewed.  Constitutional:      General: She is not in acute distress.    Comments: Appears somewhat disheveled.  Malodorous.  Pleasant 24 year old male appears stated age in no acute distress  HENT:  Head: Normocephalic and atraumatic.     Nose: Nose normal.  Eyes:     General: No scleral icterus. Cardiovascular:     Rate and Rhythm: Normal rate and regular rhythm.     Pulses: Normal pulses.     Heart sounds: Normal heart sounds.     Comments: Heart rate is 80 Pulmonary:     Effort: Pulmonary effort is normal. No respiratory distress.     Breath sounds: No wheezing.  Abdominal:     Palpations: Abdomen is soft.     Tenderness: There is no abdominal tenderness. There is no right CVA tenderness, left  CVA tenderness or guarding.     Hernia: No hernia is present.  Musculoskeletal:     Cervical back: Normal range of motion.     Right lower leg: No edema.     Left lower leg: No edema.  Skin:    General: Skin is warm and dry.     Capillary Refill: Capillary refill takes less than 2 seconds.  Neurological:     Mental Status: She is alert. Mental status is at baseline.  Psychiatric:        Mood and Affect: Mood normal.        Behavior: Behavior normal.     Comments: Somewhat distracted by television and exam room.  Easily directed.  Normal thought content.  Normal affect.     ED Results / Procedures / Treatments   Labs (all labs ordered are listed, but only abnormal results are displayed) Labs Reviewed  CBC WITH DIFFERENTIAL/PLATELET - Abnormal; Notable for the following components:      Result Value   RBC 5.92 (*)    Lymphs Abs 0.6 (*)    Eosinophils Absolute 1.1 (*)    All other components within normal limits  COMPREHENSIVE METABOLIC PANEL - Abnormal; Notable for the following components:   CO2 21 (*)    Total Protein 8.7 (*)    Albumin 5.3 (*)    AST 52 (*)    Total Bilirubin 2.3 (*)    Anion gap 21 (*)    All other components within normal limits  URINALYSIS, ROUTINE W REFLEX MICROSCOPIC - Abnormal; Notable for the following components:   Glucose, UA 50 (*)    Hgb urine dipstick SMALL (*)    Ketones, ur 80 (*)    Protein, ur 100 (*)    All other components within normal limits  RETICULOCYTES - Abnormal; Notable for the following components:   RBC. 5.87 (*)    All other components within normal limits  BLOOD GAS, VENOUS - Abnormal; Notable for the following components:   pCO2, Ven 42.6 (*)    pO2, Ven 64.0 (*)    All other components within normal limits  LIPASE, BLOOD    EKG None  Radiology No results found.  Procedures Procedures (including critical care time)  Medications Ordered in ED Medications  0.9 %  sodium chloride infusion ( Intravenous  Stopped 07/23/19 1422)  ondansetron (ZOFRAN) injection 4 mg (4 mg Intravenous Given 07/23/19 1116)  alum & mag hydroxide-simeth (MAALOX/MYLANTA) 200-200-20 MG/5ML suspension 30 mL (30 mLs Oral Given 07/23/19 1116)    And  lidocaine (XYLOCAINE) 2 % viscous mouth solution 15 mL (15 mLs Oral Given 07/23/19 1117)  famotidine (PEPCID) IVPB 20 mg premix (0 mg Intravenous Stopped 07/23/19 1422)    ED Course  I have reviewed the triage vital signs and the nursing notes.  Pertinent labs & imaging results that were available  during my care of the patient were reviewed by me and considered in my medical decision making (see chart for details).  Clinical Course as of Jul 22 1517  Tue Jul 23, 2019  1355 CMP with mildly decreased CO2.  Anion gap present.  Likely due to mild dehydration and decreased p.o. intake.  Discussed case with Dr. Rosalia Hammers who recommended IV fluids and encouraging p.o. food.  Comprehensive metabolic panel(!) [WF]  1356 Reticulocyte count unremarkable  Reticulocytes(!) [WF]  1356 No leukocytosis or anemia  CBC with Differential/Platelet(!) [WF]  1356 Tachycardia resolved with fluids  Pulse Rate: 78 [WF]  1516 Mildly elevated total bilirubin of 2.3.  Patient has no abdominal pain on my reassessment doubt biliary disease.   [WF]    Clinical Course User Index [WF] Gailen Shelter, [place]   MDM Rules/Calculators/A&P                      Patient is 24 year old male presented today with nausea, vomiting, diarrhea, fatigue.  Patient has benign abdominal exam and reassuring vitals apart from mild tachycardia noted in triage.  Patient is afebrile normal heart rate, examination.  He is requesting food.  As you does not have anorexia, right lower quadrant tenderness, white count or other indications of appendicitis I do not believe it is necessary to obtain CT imaging today.  No history of diverticulitis, no history of DKA and blood sugars within normal this today.  Has had normal bowel movements  doubt SBO/LBO.  Doubt pyelonephritis or kidney stones patient is not having any urinary symptoms.  Reticulocyte count is within normal limits.  Doubt that this is related to sickle cell trait.  Mildly elevated anion gap without acidosis likely due to patient's decreased p.o. intake as result of homelessness.  He has ketones present in his urine as well as protein which is consistent with this.  Doubt acute process causing his symptoms today.  I discussed this case with my attending physician who cosigned this note including patient's presenting symptoms, physical exam, and planned diagnostics and interventions. Attending physician stated agreement with plan or made changes to plan which were implemented.   Attending physician assessed patient at bedside.  Reassess patient at bedside at 2:15 PM.  Patient is well-appearing.  Has not vomited while in ED visit.  Has passed p.o. challenge.  Is able to eat and drink without difficulty.  Vital signs are within normal limits during my reassessment with a heart rate of 86.  Abdominal exam is benign with no tenderness to palpation.  We will discharge patient with Zofran and recommendation to follow-up with gastroenterology.  Transition of care team consulted--patient will be discharged with resources.  I have is a transitional care team will follow up with patient to give additional resources for food.  Guy [name] was evaluated in Emergency Department on 07/23/2019 for the symptoms described in the history of present illness. She was evaluated in the context of the global COVID-19 pandemic, which necessitated consideration that the patient might be at risk for infection with the SARS-CoV-2 virus that causes COVID-19. Institutional protocols and algorithms that pertain to the evaluation of patients at risk for COVID-19 are in a state of rapid change based on information released by regulatory bodies including the CDC and federal and state organizations. These  policies and algorithms were followed during the patient's care in the ED.  Final Clinical Impression(s) / ED Diagnoses Final diagnoses:  Diarrhea, unspecified type  Nausea and vomiting,  intractability of vomiting not specified, unspecified vomiting type  Generalized abdominal pain    Rx / DC Orders ED Discharge Orders         Ordered    ondansetron (ZOFRAN ODT) 4 MG disintegrating tablet  Every 8 hours PRN     07/23/19 1513           Gailen Shelter, [place] 07/23/19 1519    Margarita Grizzle, MD 07/23/19 1541

## 2019-07-23 NOTE — ED Notes (Signed)
Pt received discharge paperwork and walked to exit by charge nurse when his transportation arrived.

## 2019-07-23 NOTE — Progress Notes (Signed)
CSW called and received a call back from [employer] with [facility] who stated she would call and find out why the transport did not arrive or if it is running late.  Per Miss Pontillo the transportation is usually in the for of a white bus or Zenaida Niece that reads, "[employer]  8:30 PM CSW received a call from Miss Pontillo who stated that per Charlynn Court the driver, Charlynn Court should be calling the CSW shortly with her ETA to the [facility] ED so the CSW can let the RN know ASAP.  CSW will continue to follow for D/C needs.  Dorothe Pea. Marty Sadlowski, LCSW, LCAS, CSI Transitions of Care Clinical Social Worker Care Coordination Department Ph: [phone]

## 2019-07-23 NOTE — ED Notes (Signed)
Pt provide snack and drink. Resting comfortable denies pain. Social work confirmed the Health department will come get pt at [employer]

## 2019-07-31 ENCOUNTER — Emergency Department (HOSPITAL_COMMUNITY): Payer: Self-pay

## 2019-07-31 ENCOUNTER — Other Ambulatory Visit: Payer: Self-pay

## 2019-07-31 ENCOUNTER — Emergency Department (HOSPITAL_COMMUNITY)
Admission: EM | Admit: 2019-07-31 | Discharge: 2019-08-01 | Disposition: A | Discharge status: Home / Self Care | Payer: Self-pay | Attending: Emergency Medicine | Admitting: Emergency Medicine

## 2019-07-31 DIAGNOSIS — F17210 Nicotine dependence, cigarettes, uncomplicated: Secondary | ICD-10-CM | POA: Insufficient documentation

## 2019-07-31 DIAGNOSIS — Z23 Encounter for immunization: Secondary | ICD-10-CM | POA: Insufficient documentation

## 2019-07-31 DIAGNOSIS — Y92480 Sidewalk as the place of occurrence of the external cause: Secondary | ICD-10-CM | POA: Insufficient documentation

## 2019-07-31 DIAGNOSIS — Y999 Unspecified external cause status: Secondary | ICD-10-CM | POA: Insufficient documentation

## 2019-07-31 DIAGNOSIS — F10920 Alcohol use, unspecified with intoxication, uncomplicated: Secondary | ICD-10-CM

## 2019-07-31 DIAGNOSIS — Y939 Activity, unspecified: Secondary | ICD-10-CM | POA: Insufficient documentation

## 2019-07-31 DIAGNOSIS — S01311A Laceration without foreign body of right ear, initial encounter: Secondary | ICD-10-CM | POA: Insufficient documentation

## 2019-07-31 DIAGNOSIS — X58XXXA Exposure to other specified factors, initial encounter: Secondary | ICD-10-CM | POA: Insufficient documentation

## 2019-07-31 DIAGNOSIS — Y908 Blood alcohol level of 240 mg/100 ml or more: Secondary | ICD-10-CM | POA: Insufficient documentation

## 2019-07-31 LAB — CBC WITH DIFFERENTIAL/PLATELET
Abs Immature Granulocytes: 0.02 10*3/uL (ref 0.00–0.07)
Basophils Absolute: 0 10*3/uL (ref 0.0–0.1)
Basophils Relative: 1 %
Eosinophils Absolute: 0.1 10*3/uL (ref 0.0–0.5)
Eosinophils Relative: 2 %
HCT: 43.6 % (ref 39.0–52.0)
Hemoglobin: 14.1 g/dL (ref 13.0–17.0)
Immature Granulocytes: 1 %
Lymphocytes Relative: 45 %
Lymphs Abs: 1.9 10*3/uL (ref 0.7–4.0)
MCH: 28.4 pg (ref 26.0–34.0)
MCHC: 32.3 g/dL (ref 30.0–36.0)
MCV: 87.7 fL (ref 80.0–100.0)
Monocytes Absolute: 0.4 10*3/uL (ref 0.1–1.0)
Monocytes Relative: 9 %
Neutro Abs: 1.8 10*3/uL (ref 1.7–7.7)
Neutrophils Relative %: 42 %
Platelets: 253 10*3/uL (ref 150–400)
RBC: 4.97 MIL/uL (ref 4.22–5.81)
RDW: 13.4 % (ref 11.5–15.5)
WBC: 4.2 10*3/uL (ref 4.0–10.5)
nRBC: 0 % (ref 0.0–0.2)

## 2019-07-31 LAB — BASIC METABOLIC PANEL
Anion gap: 9 (ref 5–15)
BUN: 11 mg/dL (ref 6–20)
CO2: 26 mmol/L (ref 22–32)
Calcium: 8.5 mg/dL — ABNORMAL LOW (ref 8.9–10.3)
Chloride: 109 mmol/L (ref 98–111)
Creatinine, Ser: 1.03 mg/dL (ref 0.61–1.24)
GFR calc Af Amer: >60 mL/min (ref >60)
GFR calc non Af Amer: >60 mL/min (ref >60)
Glucose, Bld: 108 mg/dL — ABNORMAL HIGH (ref 70–99)
Potassium: 4 mmol/L (ref 3.5–5.1)
Sodium: 144 mmol/L (ref 135–145)

## 2019-07-31 LAB — ETHANOL: Alcohol, Ethyl (B): 334 mg/dL (ref <10)

## 2019-07-31 LAB — CK: Total CK: 168 U/L (ref 49–397)

## 2019-07-31 MED ORDER — TETANUS-DIPHTH-ACELL PERTUSSIS 5-2.5-18.5 LF-MCG/0.5 IM SUSP
0.5000 mL | Freq: Once | INTRAMUSCULAR | Status: AC
  Administered 2019-07-31: 0.5 mL via INTRAMUSCULAR
  Filled 2019-07-31: qty 0.5

## 2019-07-31 MED ORDER — SODIUM CHLORIDE 0.9 % IV BOLUS
1000.0000 mL | Freq: Once | INTRAVENOUS | Status: AC
  Administered 2019-07-31: 1000 mL via INTRAVENOUS

## 2019-07-31 NOTE — ED Provider Notes (Signed)
[place] DEPT Provider Note   CSN: [id number] Arrival date & time: 07/31/19  1713     History Chief Complaint  Patient presents with  . Alcohol Intoxication    Guy [name] is a 24 y.o. adult.  HPI     24 year old male, with PMH of bipolar, ADHD, PTSD, presents with alcohol intoxication.  Per report patient was sitting on the sidewalk when 911 was called.  Patient clearly intoxicated.  He admits to drinking two 40s. He has a cut to his right ear which he states he did yesterday when he hit his head on a pole.  He is unsure when his last tetanus shot was.  He denies any chest pain, shortness of breath.   Past Medical History:  Diagnosis Date  . ADHD (attention deficit hyperactivity disorder)   . Bipolar 1 disorder ([place])   . Depression   . Migraine   . PTSD (post-traumatic stress disorder)   . Sickle cell anemia [facility])     Patient Active Problem List   Diagnosis Date Noted  . Chest wall pain 05/31/2016  . Fever 05/28/2016  . Adjustment disorder with depressed mood 04/25/2016  . Attention deficit hyperactivity disorder (ADHD) 04/25/2016    Past Surgical History:  Procedure Laterality Date  . KNEE SURGERY    . WISDOM TOOTH EXTRACTION         Family History  Problem Relation Age of Onset  . Sickle cell trait Father     Social History   Tobacco Use  . Smoking status: Current Every Day Smoker    Packs/day: 1.00  . Smokeless tobacco: Never Used  Substance Use Topics  . Alcohol use: Yes  . Drug use: Yes    Types: Marijuana    Home Medications Prior to Admission medications   Medication Sig Start Date End Date Taking? Authorizing Provider  chlordiazePOXIDE (LIBRIUM) 25 MG capsule 50mg  PO TID x 1D, then 25-50mg  PO BID X 1D, then 25-50mg  PO QD X 1D Patient not taking: Reported on 10/25/2018 08/14/18   Sherwood Gambler, MD  clindamycin (CLEOCIN) 150 MG capsule Take 3 capsules (450 mg total) by mouth 3 (three) times  daily. Patient not taking: Reported on 10/25/2018 01/01/18   Muthersbaugh, Jarrett Soho, PA-C  ondansetron (ZOFRAN ODT) 4 MG disintegrating tablet Take 1 tablet (4 mg total) by mouth every 8 (eight) hours as needed for nausea or vomiting. 07/23/19   Tedd Sias, PA    Allergies    Shellfish allergy  Review of Systems   Review of Systems  Constitutional: Negative for chills and fever.  Respiratory: Negative for shortness of breath.   Cardiovascular: Negative for chest pain.  Gastrointestinal: Negative for abdominal pain, nausea and vomiting.  Skin: Positive for wound.    Physical Exam Updated Vital Signs BP (!) 140/107 (BP Location: Left Arm)   Pulse 94   Temp 98.5 F (36.9 C)   Resp 16   SpO2 100%   Physical Exam Vitals and nursing note reviewed.  Constitutional:      Appearance: She is well-developed.  HENT:     Head: Normocephalic and atraumatic.     Ears:   Eyes:     Extraocular Movements: Extraocular movements intact.     Conjunctiva/sclera: Conjunctivae normal.  Cardiovascular:     Rate and Rhythm: Normal rate and regular rhythm.     Heart sounds: Normal heart sounds. No murmur.  Pulmonary:     Effort: Pulmonary effort is normal. No respiratory distress.  Breath sounds: Normal breath sounds. No wheezing or rales.  Abdominal:     General: Bowel sounds are normal. There is no distension.     Palpations: Abdomen is soft.     Tenderness: There is no abdominal tenderness.  Musculoskeletal:        General: No tenderness or deformity. Normal range of motion.     Cervical back: Neck supple.  Skin:    General: Skin is warm and dry.     Findings: No erythema or rash.  Neurological:     Mental Status: She is alert and oriented to person, place, and time.  Psychiatric:        Behavior: Behavior normal.     ED Results / Procedures / Treatments   Labs (all labs ordered are listed, but only abnormal results are displayed) Labs Reviewed  CBC WITH  DIFFERENTIAL/PLATELET  BASIC METABOLIC PANEL  CK    EKG None  Radiology No results found.  Procedures Procedures (including critical care time)  Medications Ordered in ED Medications  sodium chloride 0.9 % bolus 1,000 mL (1,000 mLs Intravenous New Bag/Given 07/31/19 1809)  Tdap (BOOSTRIX) injection 0.5 mL (0.5 mLs Intramuscular Given 07/31/19 1811)    ED Course  I have reviewed the triage vital signs and the nursing notes.  Pertinent labs & imaging results that were available during my care of the patient were reviewed by me and considered in my medical decision making (see chart for details).    MDM Rules/Calculators/A&P                      Patient presents with intoxication.  There is a laceration to the right ear which patient states was sustained a day ago.  Laceration does appear over a day old.  Cleaned off and dressing applied.  Tetanus updated.  Labs unremarkable.  Patient was combative in CT scan and refused to the scan.  I have been to evaluate the patient multiple times and he is still clinically toxic aided.  Signout given to [employer].  Plan to observe the patient until he is clinically sober.  Final Clinical Impression(s) / ED Diagnoses Final diagnoses:  None    Rx / DC Orders ED Discharge Orders    None       Rueben Bash 07/31/19 2113    Terrilee Files, MD 08/01/19 1019

## 2019-07-31 NOTE — ED Provider Notes (Signed)
24 year old transgender male presents with ETOH intoxication. Labs are normal. Imaging ordered but pt was reportedly uncooperative in CT. At shift change pt is still intoxicated. Will reassess.  11:30PM Pt is still clinically intoxicated. Will ask nurses to ambulate and PO challenge. Care signed out to Frederik Pear PA-C who will dispo once sober    Bethel Born, Cordelia Poche 07/31/19 2355    Geoffery Lyons, MD 08/05/19 [phone]

## 2019-07-31 NOTE — ED Triage Notes (Signed)
Per EMS: Patient comes in for ETOH intoxication. Patient is reportedly homeless and was just sitting on the sidewalk drinking a beer when a bystander called 911. Patient did not want to go to jail and asked to come to the ED.

## 2019-07-31 NOTE — ED Notes (Signed)
Patient has been resting quietly with eyes closed since 19:00. Skin is warm and dry. Observed rise and fall of chest.

## 2019-07-31 NOTE — ED Provider Notes (Signed)
24 year old transgender male (AMAB) pronouns she/her received at signout from [employer] who was originally seen by [employer]. Per her HPI:   "24 year old adult, with PMH of bipolar, ADHD, PTSD, presents with alcohol intoxication.  Per report patient was sitting on the sidewalk when 911 was called.  Patient clearly intoxicated.  He admits to drinking two 40s. He has a cut to his right ear which he states he did yesterday when he hit his head on a pole.  He is unsure when his last tetanus shot was.  He denies any chest pain, shortness of breath." Physical Exam  BP 129/68 (BP Location: Right Arm)   Pulse 92   Temp 98.7 F (37.1 C) (Oral)   Resp 16   SpO2 99%   Physical Exam Vitals and nursing note reviewed.  Constitutional:      Appearance: She is well-developed.     Comments: Disheveled  HENT:     Head: Normocephalic.  Eyes:     Extraocular Movements: Extraocular movements intact.     Pupils: Pupils are equal, round, and reactive to light.  Abdominal:     General: There is no distension.     Palpations: There is no mass.     Tenderness: There is no abdominal tenderness. There is no right CVA tenderness, left CVA tenderness, guarding or rebound.     Hernia: No hernia is present.  Musculoskeletal:        General: Normal range of motion.     Cervical back: Normal range of motion.  Neurological:     Mental Status: She is alert and oriented to person, place, and time.     Comments: Ambulatory without ataxia.  Speech is not slurred.     ED Course/Procedures     Procedures  MDM   24 year old transgender male received a signout from [place] [employer].  Patient was originally seen by PA Hendrick after the patient was brought in by EMS after found sitting on the sidewalk.  The patient was noted to have a fall yesterday.  CT head was originally ordered, but the patient refused the scan.  After the patient was clinically sober and ambulated by me, the patient continued to refuse CT imaging.   Low suspicion for intracranial hemorrhage given that injury was more than 24 hours ago and patient has no neurologic deficits.  After the patient was sober, she denied SI, HI, or auditory visualizations.  Safe for discharge with outpatient follow-up.  We will give outpatient rehab resources.  The patient was encouraged to follow-up with primary care for new or worsening symptoms.  ER return precautions given.  Safe for outpatient follow-up at this time.   Barkley Boards, PA-C 08/01/19 0855    Nira Conn, MD 08/02/19 [phone]

## 2019-07-31 NOTE — Progress Notes (Signed)
This technologist asked patient to move over on the CT table for her CT head scan.  Per patient "we are getting on her damn nerves" and she refused the scan.  Patient was returned to her room and PA notified

## 2019-08-01 NOTE — Discharge Instructions (Addendum)
Thank you for allowing me to care for you today in the Emergency Department.  You were seen yesterday for alcohol intoxication.  You were found to be intoxicated.  Please use the attached resources for assistance if you would like to stop drinking.  You can also follow-up with [facility].  Please follow-up with [facility] if you have any other additional concerns.  Return to the emergency department for new or worsening symptoms.

## 2019-08-27 ENCOUNTER — Emergency Department (HOSPITAL_COMMUNITY)
Admission: EM | Admit: 2019-08-27 | Discharge: 2019-08-27 | Disposition: A | Discharge status: Home / Self Care | Payer: Self-pay | Attending: Emergency Medicine | Admitting: Emergency Medicine

## 2019-08-27 ENCOUNTER — Other Ambulatory Visit: Payer: Self-pay

## 2019-08-27 ENCOUNTER — Encounter (HOSPITAL_COMMUNITY): Payer: Self-pay

## 2019-08-27 DIAGNOSIS — Z765 Malingerer [conscious simulation]: Secondary | ICD-10-CM | POA: Insufficient documentation

## 2019-08-27 DIAGNOSIS — D573 Sickle-cell trait: Secondary | ICD-10-CM | POA: Insufficient documentation

## 2019-08-27 DIAGNOSIS — F1010 Alcohol abuse, uncomplicated: Secondary | ICD-10-CM | POA: Insufficient documentation

## 2019-08-27 DIAGNOSIS — F17210 Nicotine dependence, cigarettes, uncomplicated: Secondary | ICD-10-CM | POA: Insufficient documentation

## 2019-08-27 NOTE — ED Notes (Signed)
Patient verbalizes understanding of discharge instructions. Opportunity for questioning and answers were provided. Armband removed by staff, pt discharged from ED. Ambulated out to lobby  

## 2019-08-27 NOTE — ED Notes (Signed)
Pt refused blood work in triage, pt asked to sit in wheelchair to be wheeled to waiting area and pt states "waiting area? This is some bullshit"

## 2019-08-27 NOTE — ED Triage Notes (Signed)
Pt reports sickle cell pain in his abd and headache since yesterday. Pt also reports emesis x5, has not eaten anything in 3 days.
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
## 2019-09-07 ENCOUNTER — Other Ambulatory Visit: Payer: Self-pay

## 2019-09-07 ENCOUNTER — Encounter (HOSPITAL_COMMUNITY): Payer: Self-pay

## 2019-09-07 ENCOUNTER — Emergency Department (HOSPITAL_COMMUNITY)
Admission: EM | Admit: 2019-09-07 | Discharge: 2019-09-07 | Disposition: A | Discharge status: Other Acute Inpatient Hospital | Payer: Self-pay | Attending: Emergency Medicine | Admitting: Emergency Medicine

## 2019-09-07 DIAGNOSIS — F10920 Alcohol use, unspecified with intoxication, uncomplicated: Secondary | ICD-10-CM

## 2019-09-07 DIAGNOSIS — Z20822 Contact with and (suspected) exposure to covid-19: Secondary | ICD-10-CM | POA: Insufficient documentation

## 2019-09-07 DIAGNOSIS — R45851 Suicidal ideations: Secondary | ICD-10-CM | POA: Insufficient documentation

## 2019-09-07 DIAGNOSIS — F332 Major depressive disorder, recurrent severe without psychotic features: Secondary | ICD-10-CM | POA: Insufficient documentation

## 2019-09-07 DIAGNOSIS — F10229 Alcohol dependence with intoxication, unspecified: Secondary | ICD-10-CM | POA: Insufficient documentation

## 2019-09-07 DIAGNOSIS — F17210 Nicotine dependence, cigarettes, uncomplicated: Secondary | ICD-10-CM | POA: Insufficient documentation

## 2019-09-07 DIAGNOSIS — Y908 Blood alcohol level of 240 mg/100 ml or more: Secondary | ICD-10-CM | POA: Insufficient documentation

## 2019-09-07 LAB — SALICYLATE LEVEL: Salicylate Lvl: <7 mg/dL — ABNORMAL LOW (ref 7.0–30.0)

## 2019-09-07 LAB — BASIC METABOLIC PANEL
Anion gap: 12 (ref 5–15)
BUN: 9 mg/dL (ref 6–20)
CO2: 25 mmol/L (ref 22–32)
Calcium: 9.4 mg/dL (ref 8.9–10.3)
Chloride: 104 mmol/L (ref 98–111)
Creatinine, Ser: 1.01 mg/dL (ref 0.61–1.24)
GFR calc Af Amer: >60 mL/min (ref >60)
GFR calc non Af Amer: >60 mL/min (ref >60)
Glucose, Bld: 116 mg/dL — ABNORMAL HIGH (ref 70–99)
Potassium: 4.1 mmol/L (ref 3.5–5.1)
Sodium: 141 mmol/L (ref 135–145)

## 2019-09-07 LAB — CBC WITH DIFFERENTIAL/PLATELET
Abs Immature Granulocytes: 0.04 10*3/uL (ref 0.00–0.07)
Basophils Absolute: 0.1 10*3/uL (ref 0.0–0.1)
Basophils Relative: 1 %
Eosinophils Absolute: 0.3 10*3/uL (ref 0.0–0.5)
Eosinophils Relative: 4 %
HCT: 48.5 % (ref 39.0–52.0)
Hemoglobin: 15.7 g/dL (ref 13.0–17.0)
Immature Granulocytes: 1 %
Lymphocytes Relative: 32 %
Lymphs Abs: 2.2 10*3/uL (ref 0.7–4.0)
MCH: 28.3 pg (ref 26.0–34.0)
MCHC: 32.4 g/dL (ref 30.0–36.0)
MCV: 87.5 fL (ref 80.0–100.0)
Monocytes Absolute: 0.7 10*3/uL (ref 0.1–1.0)
Monocytes Relative: 11 %
Neutro Abs: 3.5 10*3/uL (ref 1.7–7.7)
Neutrophils Relative %: 51 %
Platelets: 284 10*3/uL (ref 150–400)
RBC: 5.54 MIL/uL (ref 4.22–5.81)
RDW: 13.3 % (ref 11.5–15.5)
WBC: 6.8 10*3/uL (ref 4.0–10.5)
nRBC: 0 % (ref 0.0–0.2)

## 2019-09-07 LAB — ETHANOL: Alcohol, Ethyl (B): 272 mg/dL — ABNORMAL HIGH (ref <10)

## 2019-09-07 LAB — RAPID URINE DRUG SCREEN, HOSP PERFORMED
Amphetamines: NOT DETECTED
Barbiturates: NOT DETECTED
Benzodiazepines: NOT DETECTED
Cocaine: NOT DETECTED
Opiates: NOT DETECTED
Tetrahydrocannabinol: NOT DETECTED

## 2019-09-07 LAB — ACETAMINOPHEN LEVEL: Acetaminophen (Tylenol), Serum: <10 ug/mL — ABNORMAL LOW (ref 10–30)

## 2019-09-07 LAB — SARS CORONAVIRUS 2 (TAT 6-24 HRS): SARS Coronavirus 2: NEGATIVE

## 2019-09-07 NOTE — ED Notes (Signed)
Pt given Ginger Ale as requested - declined po food.

## 2019-09-07 NOTE — ED Notes (Signed)
Pt verbalized understanding and agreement w/tx plan - Accepted to [facility] - Declined to call family member/friend to notify of tx plan - ALL Belongings - 2 labeled belongings bags, 1 black/pink back pack, and 1 valuables envelope - Safe Transport - Pt aware.

## 2019-09-07 NOTE — ED Notes (Addendum)
Pt arrived to [facility] via stretcher d/t states feels weak at this time. Pt given Ginger Ale as requested. Aware lunch is being ordered. Pt aware waiting for Inpt Placement and will need to show able to ambulate and perform own ADL's - voiced understanding.

## 2019-09-07 NOTE — ED Notes (Signed)
Pt ambulated to bathroom and back to room w/o difficulty.  

## 2019-09-07 NOTE — ED Notes (Signed)
Pt ambulatory to and from BR. Sandwich, soda, and warm blanket provided. Pt denies any other needs at this time. Will continue to monitor

## 2019-09-07 NOTE — ED Notes (Signed)
Per [place], [facility] SW, pt has been accpeted to [facility].

## 2019-09-07 NOTE — Progress Notes (Signed)
Patient meets criteria for inpatient treatment per Nira Conn, NP. No appropriate beds at [facility] currently. CSW faxed referrals to the following facilities for review:  [facility]  [facility]  [facility]   [facility]  [facility]   [facility]   [facility]  [facility]   [facility]  [facility]  [facility]   [facility]  [facility]    TTS will continue to seek bed placement.     Ruthann Cancer MSW, [facility] Clincal Social Worker Disposition  [facility] Ph: [phone] Fax: [phone] 09/07/2019 10:07 AM

## 2019-09-07 NOTE — ED Provider Notes (Signed)
[facility] EMERGENCY DEPARTMENT Provider Note   CSN: [id number] Arrival date & time: 09/07/19  0215     History Chief Complaint  Patient presents with  . Suicidal    Guy [name] is a 24 y.o. adult.  Patient with history of depression, ADHD, PTSD, bipolar, sickle cell anemia who presents via EMS, called by [employer] for patient expressing SI. [employer] on scene due to disturbance related to alcohol intoxication. EMS called when patient voiced SI, no definite plan. Here the patient is talking nonstop, difficult to keep on topic, will not answer questions of this provider. He is eating a sandwich and appears in NAD.   The history is provided by the patient. No language interpreter was used.       Past Medical History:  Diagnosis Date  . ADHD (attention deficit hyperactivity disorder)   . Bipolar 1 disorder (HCC)   . Depression   . Migraine   . PTSD (post-traumatic stress disorder)   . Sickle cell anemia [facility])     Patient Active Problem List   Diagnosis Date Noted  . Chest wall pain 05/31/2016  . Fever 05/28/2016  . Adjustment disorder with depressed mood 04/25/2016  . Attention deficit hyperactivity disorder (ADHD) 04/25/2016    Past Surgical History:  Procedure Laterality Date  . KNEE SURGERY    . WISDOM TOOTH EXTRACTION         Family History  Problem Relation Age of Onset  . Sickle cell trait Father     Social History   Tobacco Use  . Smoking status: Current Every Day Smoker    Packs/day: 1.00  . Smokeless tobacco: Never Used  Substance Use Topics  . Alcohol use: Yes  . Drug use: Yes    Types: Marijuana    Home Medications Prior to Admission medications   Medication Sig Start Date End Date Taking? Authorizing Provider  ondansetron (ZOFRAN ODT) 4 MG disintegrating tablet Take 1 tablet (4 mg total) by mouth every 8 (eight) hours as needed for nausea or vomiting. 07/23/19   Gailen Shelter, PA    Allergies    Shellfish  allergy  Review of Systems   Review of Systems  Reason unable to perform ROS: Patient not answering questions.    Physical Exam Updated Vital Signs There were no vitals taken for this visit.  Physical Exam Nursing note reviewed.  Constitutional:      General: She is not in acute distress.    Appearance: She is well-developed. She is not ill-appearing.  Pulmonary:     Effort: Pulmonary effort is normal.  Musculoskeletal:     Cervical back: Normal range of motion.  Skin:    General: Skin is warm and dry.  Neurological:     Mental Status: She is alert.  Psychiatric:        Mood and Affect: Mood is elated.        Speech: Speech is tangential.        Behavior: Behavior is uncooperative and hyperactive.        Thought Content: Thought content includes suicidal ideation.     ED Results / Procedures / Treatments   Labs (all labs ordered are listed, but only abnormal results are displayed) Labs Reviewed  CBC WITH DIFFERENTIAL/PLATELET  BASIC METABOLIC PANEL  SALICYLATE LEVEL  ACETAMINOPHEN LEVEL  ETHANOL  RAPID URINE DRUG SCREEN, HOSP PERFORMED    EKG None  Radiology No results found.  Procedures Procedures (including critical care time)  Medications  Ordered in ED Medications - No data to display  ED Course  I have reviewed the triage vital signs and the nursing notes.  Pertinent labs & imaging results that were available during my care of the patient were reviewed by me and considered in my medical decision making (see chart for details).    MDM Rules/Calculators/A&P                      Patient to ED with 9 visits in last 6 months, frequently for alcohol intoxication, hunger, homelessness, and suicidal ideation.   The patient calms down enough to go to sleep. Labs completed. He is considered medically cleared for TTS evaluation.   4:40 - TTS ready to see the patient when in a room to perform assessment.   Final Clinical Impression(s) / ED  Diagnoses Final diagnoses:  None   1. Suicidal ideation 2. Alcohol intoxication  Rx / DC Orders ED Discharge Orders    None       Charlann Lange, PA-C 09/07/19 [place], Delice Bison, DO 09/07/19 [phone]

## 2019-09-07 NOTE — Progress Notes (Signed)
Pt accepted to [facility] Dr. Suann Larry is the accepting provider.   Call report to [phone] Kriste Basque, RN @ [facility] ED notified.   Pt is  Voluntary Pt may be transported by [employer] Pt scheduled to arrive as soon as transport has been set up.   Ruthann Cancer MSW, [facility] Clincal Social Worker Disposition  [facility] Ph: [phone] Fax: [phone]  09/07/2019 2:31 PM

## 2019-09-07 NOTE — BHH Counselor (Signed)
Per nurse, pt can be assessed once she finds pt a room, nurse states she is currently occupied with another patient will call TTS when cart set up in room and when pt can be seen.

## 2019-09-07 NOTE — ED Notes (Signed)
This RN inventoried this pts belongings and all personal items were labeled and locked in [place] [address].  Valuables Envelope left with Security.   No Medications Found.

## 2019-09-07 NOTE — ED Notes (Signed)
Pt on phone with TTS. 

## 2019-09-07 NOTE — ED Triage Notes (Signed)
Pt BIB [facility] from home with SI; [employer] was on scene for ETOH use and EMS was called for the SI  Pt originally reported SI with plan to cut himself or walk into traffic  Pt slurring words currently and acting slightly aggressive with EMS. Not reporting any SI right now.

## 2019-09-07 NOTE — ED Notes (Signed)
Care endorsed to William, RN 
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
## 2019-09-07 NOTE — ED Notes (Signed)
Staffing Office aware of need for Sitter at 0750 - Called back and advised Sitter should be coming momentarily.

## 2019-09-07 NOTE — ED Provider Notes (Signed)
Spoke with Jerrol Banana, RN and per our conversation will order 6-24 hr send-out COVID-19 testing.      Lorelee New, PA-C 09/07/19 0905    Clarene Duke Ambrose Finland, MD 09/08/19 [phone]

## 2019-09-07 NOTE — Progress Notes (Signed)
Pt has tentatively been accepted to [facility] pending a negative COVID test.   Ruthann Cancer MSW, LCSWA Clincal Social Worker Disposition  [facility] Ph: [phone] Fax: [phone]

## 2019-09-07 NOTE — ED Notes (Signed)
bfast ordered by Brooke 

## 2019-09-16 ENCOUNTER — Emergency Department (HOSPITAL_COMMUNITY)
Admission: EM | Admit: 2019-09-16 | Discharge: 2019-09-16 | Disposition: A | Discharge status: Home / Self Care | Payer: Self-pay | Attending: Emergency Medicine | Admitting: Emergency Medicine

## 2019-09-16 ENCOUNTER — Encounter (HOSPITAL_COMMUNITY): Payer: Self-pay | Admitting: Emergency Medicine

## 2019-09-16 DIAGNOSIS — Z5900 Homelessness unspecified: Secondary | ICD-10-CM

## 2019-09-16 DIAGNOSIS — R456 Violent behavior: Secondary | ICD-10-CM | POA: Insufficient documentation

## 2019-09-16 DIAGNOSIS — M791 Myalgia, unspecified site: Secondary | ICD-10-CM | POA: Insufficient documentation

## 2019-09-16 DIAGNOSIS — R079 Chest pain, unspecified: Secondary | ICD-10-CM | POA: Insufficient documentation

## 2019-09-16 DIAGNOSIS — R52 Pain, unspecified: Secondary | ICD-10-CM

## 2019-09-16 DIAGNOSIS — F17200 Nicotine dependence, unspecified, uncomplicated: Secondary | ICD-10-CM | POA: Insufficient documentation

## 2019-09-16 HISTORY — DX: Sickle-cell trait: D57.3

## 2019-09-16 NOTE — ED Notes (Signed)
When asked pt to step back out in the waiting room pt stated, "Bitch, y'all rude." Pt ambulated to lobby with a steady gait.

## 2019-09-16 NOTE — ED Triage Notes (Signed)
Pt told EMS that they were experiencing Sickle Cell Pain however, pt only has the Sickle Cell Trait. Pt states they have 3/10 pain all over. Pt denies ETOH or drug use. Pt is demanding a blanket in triage.

## 2019-09-16 NOTE — ED Notes (Signed)
Pt attempted to leave before triage was completed due to not getting a blanket when asked. Pt was told that vitals needed to be completed first. Pt attempting walk out, stopped by [employer] and encouraged to stay and be seen by [facility] officer. Pt back in triage room.

## 2019-09-16 NOTE — ED Provider Notes (Signed)
[facility]-EMERGENCY DEPT Provider Note   CSN: [id number] Arrival date & time: 09/16/19  0113     History Chief Complaint  Patient presents with  . Generalized Body Aches    Guy [name] is a 24 y.o. adult.  Patient presents to the ED with a chief complaint of body aches. She states that the pain began yesterday.  She states that she doesn't have anywhere to sleep and would like to sleep here.  She denies any other associated symptoms.  She denies any treatments prior to arrival.  The history is provided by the patient. No language interpreter was used.       Past Medical History:  Diagnosis Date  . ADHD (attention deficit hyperactivity disorder)   . Bipolar 1 disorder (HCC)   . Depression   . Migraine   . PTSD (post-traumatic stress disorder)   . Sickle cell trait [facility])     Patient Active Problem List   Diagnosis Date Noted  . Chest wall pain 05/31/2016  . Fever 05/28/2016  . Adjustment disorder with depressed mood 04/25/2016  . Attention deficit hyperactivity disorder (ADHD) 04/25/2016    Past Surgical History:  Procedure Laterality Date  . KNEE SURGERY    . WISDOM TOOTH EXTRACTION         Family History  Problem Relation Age of Onset  . Sickle cell trait Father     Social History   Tobacco Use  . Smoking status: Current Every Day Smoker    Packs/day: 1.00  . Smokeless tobacco: Never Used  Substance Use Topics  . Alcohol use: Yes  . Drug use: Yes    Types: Marijuana    Home Medications Prior to Admission medications   Medication Sig Start Date End Date Taking? Authorizing Provider  ondansetron (ZOFRAN ODT) 4 MG disintegrating tablet Take 1 tablet (4 mg total) by mouth every 8 (eight) hours as needed for nausea or vomiting. Patient not taking: Reported on 09/07/2019 07/23/19   Gailen Shelter, PA    Allergies    Shellfish allergy  Review of Systems   Review of Systems  Constitutional: Negative for chills and fever.   Respiratory: Negative for cough and shortness of breath.   Cardiovascular: Positive for chest pain.    Physical Exam Updated Vital Signs BP (!) 147/78   Pulse (!) 103   Temp 98.9 F (37.2 C)   Resp 16   SpO2 100%   Physical Exam Vitals and nursing note reviewed.  Constitutional:      General: She is not in acute distress.    Appearance: She is well-developed. She is not ill-appearing.  HENT:     Head: Normocephalic and atraumatic.  Eyes:     Conjunctiva/sclera: Conjunctivae normal.  Cardiovascular:     Rate and Rhythm: Normal rate.  Pulmonary:     Effort: Pulmonary effort is normal. No respiratory distress.  Abdominal:     General: There is no distension.  Musculoskeletal:     Cervical back: Neck supple.     Comments: Moves all extremities  Skin:    General: Skin is warm and dry.  Neurological:     Mental Status: She is alert and oriented to person, place, and time.  Psychiatric:        Mood and Affect: Mood normal.        Behavior: Behavior normal.     ED Results / Procedures / Treatments   Labs (all labs ordered are listed, but only abnormal  results are displayed) Labs Reviewed - No data to display  EKG EKG Interpretation  Date/Time:  Monday September 16 2019 03:58:26 EDT Ventricular Rate:  102 PR Interval:    QRS Duration: 74 QT Interval:  326 QTC Calculation: 425 R Axis:   81 Text Interpretation: Sinus tachycardia LVH by voltage ST elev, probable normal early repol pattern Baseline wander in lead(s) I II No significant change since last tracing 17 Jun 2019 Confirmed by Rolland Porter [phone]) on 09/16/2019 4:13:59 AM   Radiology No results found.  Procedures Procedures (including critical care time)  Medications Ordered in ED Medications - No data to display  ED Course  I have reviewed the triage vital signs and the nursing notes.  Pertinent labs & imaging results that were available during my care of the patient were reviewed by me and considered in  my medical decision making (see chart for details).    MDM Rules/Calculators/A&P                      Patient is homeless.  Is asking to be left alone so she can sleep.  When pressed on why she is here, she responds "chest pain." I have a low suspicion for ACS in this 24 yo.  Will check EKG.  If normal, will plan for discharge.  Will give information regarding shelters. Final Clinical Impression(s) / ED Diagnoses Final diagnoses:  Chest pain, unspecified type  Body aches  Homelessness    Rx / DC Orders ED Discharge Orders    None       Montine Circle, PA-C 09/16/19 6962    Rolland Porter, MD 09/16/19 [phone]

## 2019-09-16 NOTE — ED Notes (Signed)
Pt refused vital signs. Pt refused to be discharged. Security called after pt begins cursing at staff and threatening hospital employees. Pt stable and escorted out by security.

## 2019-10-16 ENCOUNTER — Encounter (HOSPITAL_COMMUNITY): Payer: Self-pay | Admitting: Emergency Medicine

## 2019-10-16 ENCOUNTER — Emergency Department (HOSPITAL_COMMUNITY)
Admission: EM | Admit: 2019-10-16 | Discharge: 2019-10-16 | Disposition: A | Discharge status: Home / Self Care | Payer: Self-pay | Attending: Emergency Medicine | Admitting: Emergency Medicine

## 2019-10-16 ENCOUNTER — Ambulatory Visit (HOSPITAL_COMMUNITY)
Admission: RE | Admit: 2019-10-16 | Discharge: 2019-10-16 | Disposition: A | Discharge status: Home / Self Care | Payer: Self-pay | Attending: Psychiatry | Admitting: Psychiatry

## 2019-10-16 DIAGNOSIS — F17200 Nicotine dependence, unspecified, uncomplicated: Secondary | ICD-10-CM | POA: Insufficient documentation

## 2019-10-16 DIAGNOSIS — E162 Hypoglycemia, unspecified: Secondary | ICD-10-CM

## 2019-10-16 DIAGNOSIS — D573 Sickle-cell trait: Secondary | ICD-10-CM | POA: Insufficient documentation

## 2019-10-16 DIAGNOSIS — Z1389 Encounter for screening for other disorder: Secondary | ICD-10-CM | POA: Insufficient documentation

## 2019-10-16 DIAGNOSIS — F1020 Alcohol dependence, uncomplicated: Secondary | ICD-10-CM | POA: Insufficient documentation

## 2019-10-16 DIAGNOSIS — R112 Nausea with vomiting, unspecified: Secondary | ICD-10-CM

## 2019-10-16 DIAGNOSIS — Z590 Homelessness: Secondary | ICD-10-CM | POA: Insufficient documentation

## 2019-10-16 LAB — URINALYSIS, ROUTINE W REFLEX MICROSCOPIC
Bilirubin Urine: NEGATIVE
Glucose, UA: >=500 mg/dL — AB
Hgb urine dipstick: NEGATIVE
Ketones, ur: 20 mg/dL — AB
Leukocytes,Ua: NEGATIVE
Nitrite: NEGATIVE
Protein, ur: 30 mg/dL — AB
Specific Gravity, Urine: 1.017 (ref 1.005–1.030)
pH: 5 (ref 5.0–8.0)

## 2019-10-16 LAB — CBC
HCT: 45.5 % (ref 39.0–52.0)
Hemoglobin: 15.2 g/dL (ref 13.0–17.0)
MCH: 29.9 pg (ref 26.0–34.0)
MCHC: 33.4 g/dL (ref 30.0–36.0)
MCV: 89.4 fL (ref 80.0–100.0)
Platelets: 256 10*3/uL (ref 150–400)
RBC: 5.09 MIL/uL (ref 4.22–5.81)
RDW: 13.6 % (ref 11.5–15.5)
WBC: 6.4 10*3/uL (ref 4.0–10.5)
nRBC: 0 % (ref 0.0–0.2)

## 2019-10-16 LAB — COMPREHENSIVE METABOLIC PANEL
ALT: 27 U/L (ref 0–44)
AST: 51 U/L — ABNORMAL HIGH (ref 15–41)
Albumin: 4.6 g/dL (ref 3.5–5.0)
Alkaline Phosphatase: 59 U/L (ref 38–126)
Anion gap: 19 — ABNORMAL HIGH (ref 5–15)
BUN: 14 mg/dL (ref 6–20)
CO2: 16 mmol/L — ABNORMAL LOW (ref 22–32)
Calcium: 8.5 mg/dL — ABNORMAL LOW (ref 8.9–10.3)
Chloride: 102 mmol/L (ref 98–111)
Creatinine, Ser: 1.01 mg/dL (ref 0.61–1.24)
GFR calc Af Amer: >60 mL/min (ref >60)
GFR calc non Af Amer: >60 mL/min (ref >60)
Glucose, Bld: 175 mg/dL — ABNORMAL HIGH (ref 70–99)
Potassium: 4.9 mmol/L (ref 3.5–5.1)
Sodium: 137 mmol/L (ref 135–145)
Total Bilirubin: 0.9 mg/dL (ref 0.3–1.2)
Total Protein: 7.5 g/dL (ref 6.5–8.1)

## 2019-10-16 LAB — LIPASE, BLOOD: Lipase: 19 U/L (ref 11–51)

## 2019-10-16 MED ORDER — SODIUM CHLORIDE 0.9 % IV BOLUS
1000.0000 mL | Freq: Once | INTRAVENOUS | Status: AC
  Administered 2019-10-16: 1000 mL via INTRAVENOUS

## 2019-10-16 MED ORDER — ONDANSETRON 4 MG PO TBDP: 4.0000 mg | ORAL_TABLET | Freq: Three times a day (TID) | ORAL | 0 refills | Status: DC | PRN

## 2019-10-16 MED ORDER — SODIUM CHLORIDE 0.9% FLUSH: 3.0000 mL | Freq: Once | INTRAVENOUS | Status: DC

## 2019-10-16 NOTE — ED Triage Notes (Addendum)
Patient homeless here complaining of n/v that started today. CBG 64+ initally given D10 now 210.Marland Kitchen Emesis x1 in route. 4mg  Zofran given.

## 2019-10-16 NOTE — BH Assessment (Signed)
Assessment Note  Guy [name] is a 24 y.o. adult who came to [facility] due to a desire to detox from EtOH. Pt states he regularly drinks 6 40's of alcohol at night and that the last time he drank was last night. Pt denies current or previous SI, any previous attempts to kill himself, or a current plan to kill himself. He also denies HI, AVH, NSSIB, and access to guns/weapons. Pt states that, along w/ drinking EtOH, he also smokes approximately 1 joint of marijuana/month.  Pt states he is currently homeless, which he lists as a stressor. He states he is not currently attending school or working. He shares he has a court hearing on Nov 07, 2019 for B&E and larceny charges. Pt states he does not currently have a therapist nor a psychiatrist, though he did receive information re: obtaining these services when he was last in the hospital.  Pt's protective factors include no hx and current denial of SI, HI, and AVH and his desire to seek detox services.  Pt declined to provide verbal consent to contact a family member/friend for collateral information.  Pt is oriented x4. His recent and remote memory is intact. Pt was cooperative throughout the assessment process. Pt's insight is fair; his judgement and impulse control is poor at this time.   Diagnosis: F10.20, Alcohol use disorder, Severe   Past Medical History:  Past Medical History:  Diagnosis Date  . ADHD (attention deficit hyperactivity disorder)   . Bipolar 1 disorder ([place])   . Depression   . Migraine   . PTSD (post-traumatic stress disorder)   . Sickle cell trait [facility])     Past Surgical History:  Procedure Laterality Date  . KNEE SURGERY    . WISDOM TOOTH EXTRACTION      Family History:  Family History  Problem Relation Age of Onset  . Sickle cell trait Father     Social History:  reports that she has been smoking. She has been smoking about 1.00 pack per day. She has never used smokeless tobacco. She reports current alcohol  use. She reports current drug use. Drug: Marijuana.  Additional Social History:  Alcohol / Drug Use Pain Medications: Please see MAR Prescriptions: Please see MAR Over the Counter: Please see MAR History of alcohol / drug use?: Yes Longest period of sobriety (when/how long): Unknown Substance #1 Name of Substance 1: EtOH 1 - Age of First Use: Unknown 1 - Amount (size/oz): 6 40's 1 - Frequency: Daily 1 - Duration: Unknown 1 - Last Use / Amount: Yesterday Substance #2 Name of Substance 2: Marijuana 2 - Age of First Use: Unknown 2 - Amount (size/oz): 1 joint 2 - Frequency: 1x/month 2 - Duration: Unknown 2 - Last Use / Amount: Last month  CIWA: CIWA-Ar BP: (!) 143/86 Pulse Rate: (!) 105 COWS:    Allergies:  Allergies  Allergen Reactions  . Shellfish Allergy Swelling    Home Medications: (Not in a hospital admission)   OB/GYN Status:  No LMP recorded.  General Assessment Data Location of Assessment: [facility] Assessment Services TTS Assessment: In system Is this a Tele or Face-to-Face Assessment?: Face-to-Face Is this an Initial Assessment or a Re-assessment for this encounter?: Initial Assessment Patient Accompanied by:: N/A Language Other than English: No Living Arrangements: Homeless/Shelter What gender do you identify as?: Male Marital status: Single Living Arrangements: Alone Can pt return to current living arrangement?: Yes Admission Status: Voluntary Is patient capable of signing voluntary admission?: Yes Referral Source: Self/Family/Friend  Insurance type: None  Medical Screening Exam [facility] Walk-in ONLY) Medical Exam completed: Yes  Crisis Care Plan Living Arrangements: Alone Legal Guardian: Other:(Self) Name of Psychiatrist: None Name of Therapist: None  Education Status Is patient currently in school?: No Is the patient employed, unemployed or receiving disability?: Unemployed  Risk to self with the past 6 months Suicidal Ideation: No Has patient  been a risk to self within the past 6 months prior to admission? : No Suicidal Intent: No Has patient had any suicidal intent within the past 6 months prior to admission? : No Is patient at risk for suicide?: No Suicidal Plan?: No Has patient had any suicidal plan within the past 6 months prior to admission? : No Access to Means: No What has been your use of drugs/alcohol within the last 12 months?: Pt acknowledges EtOH and marijuana use Previous Attempts/Gestures: No How many times?: 0 Other Self Harm Risks: Pt is currently homeless Triggers for Past Attempts: None known Intentional Self Injurious Behavior: None Family Suicide History: Unable to assess Recent stressful life event(s): Financial Problems, Legal Issues Persecutory voices/beliefs?: No Depression: Yes Depression Symptoms: Despondent, Fatigue, Loss of interest in usual pleasures, Feeling worthless/self pity Substance abuse history and/or treatment for substance abuse?: Yes Suicide prevention information given to non-admitted patients: Yes  Risk to Others within the past 6 months Homicidal Ideation: No Does patient have any lifetime risk of violence toward others beyond the six months prior to admission? : No Thoughts of Harm to Others: No Current Homicidal Intent: No Current Homicidal Plan: No Access to Homicidal Means: No Identified Victim: None noted History of harm to others?: No Assessment of Violence: None Noted Violent Behavior Description: None noted Does patient have access to weapons?: (Pt denies access to guns/weapons) Criminal Charges Pending?: Yes Describe Pending Criminal Charges: Breaking and entering, larceny Does patient have a court date: Yes Court Date: 11/07/19 Is patient on probation?: No  Psychosis Hallucinations: None noted Delusions: None noted  Mental Status Report Appearance/Hygiene: Body odor, Disheveled, Poor hygiene Eye Contact: Fair Motor Activity: Freedom of movement, Other  (Comment)(Pt is lying down on his side on his bed in his hospital room) Speech: Logical/coherent Level of Consciousness: Quiet/awake Mood: Depressed Affect: Appropriate to circumstance Anxiety Level: Minimal Thought Processes: Circumstantial Judgement: Impaired Orientation: Person, Place, Time, Situation Obsessive Compulsive Thoughts/Behaviors: None  Cognitive Functioning Concentration: Fair Memory: Recent Intact, Remote Intact Is patient IDD: No Insight: Fair Impulse Control: Poor Appetite: Fair Have you had any weight changes? : No Change Sleep: No Change Total Hours of Sleep: (Pt does not know) Vegetative Symptoms: None  ADLScreening [facility] Assessment Services) Patient's cognitive ability adequate to safely complete daily activities?: Yes Patient able to express need for assistance with ADLs?: Yes Independently performs ADLs?: Yes (appropriate for developmental age)  Prior Inpatient Therapy Prior Inpatient Therapy: Yes Prior Therapy Dates: Multiple Prior Therapy Facilty/Provider(s): [facility] & others Reason for Treatment: SA, MDD  Prior Outpatient Therapy Prior Outpatient Therapy: No Does patient have an ACCT team?: No Does patient have Intensive In-House Services?  : No Does patient have [facility] services? : No Does patient have P4CC services?: No  ADL Screening (condition at time of admission) Patient's cognitive ability adequate to safely complete daily activities?: Yes Is the patient deaf or have difficulty hearing?: No Does the patient have difficulty seeing, even when wearing glasses/contacts?: No Does the patient have difficulty concentrating, remembering, or making decisions?: No Patient able to express need for assistance with ADLs?: Yes Does  the patient have difficulty dressing or bathing?: No Independently performs ADLs?: Yes (appropriate for developmental age) Does the patient have difficulty walking or climbing stairs?: No Weakness of Legs:  None Weakness of Arms/Hands: None  Home Assistive Devices/Equipment Home Assistive Devices/Equipment: None  Therapy Consults (therapy consults require a physician order) PT Evaluation Needed: No OT Evalulation Needed: No SLP Evaluation Needed: No Abuse/Neglect Assessment (Assessment to be complete while patient is alone) Abuse/Neglect Assessment Can Be Completed: Unable to assess, patient is non-responsive or altered mental status Values / Beliefs Cultural Requests During Hospitalization: (UTA) Spiritual Requests During Hospitalization: (UTA) Consults Spiritual Care Consult Needed: (UTA) Transition of Care Team Consult Needed: (UTA) Advance Directives (For Healthcare) Does Patient Have a Medical Advance Directive?: Unable to assess, patient is non-responsive or altered mental status          Disposition: Marciano Sequin, NP, reviewed pt's chart and information and determined pt can be psych cleared. Pt was offered the option to be provided transportation to a detox facility, which he accepted. Pt was provided transportation to a detox facility at his request.   Disposition Initial Assessment Completed for this Encounter: Yes Disposition of Patient: Discharge(Janet Gerilyn Pilgrim, NP, determined pt can be psych cleared) Patient refused recommended treatment: No Mode of transportation if patient is discharged/movement?: Other (comment)(Safe Transport) Patient referred to: Other (Comment)(Pt is being provided a ride to [facility] for detox)  On Site Evaluation by:   Reviewed with Physician:    Ralph Dowdy 10/16/2019 6:19 PM

## 2019-10-16 NOTE — ED Provider Notes (Signed)
[name] DEPT Provider Note   CSN: [id number] Arrival date & time: 10/16/19  0945     History Chief Complaint  Patient presents with  . Nausea  . Emesis    Guy [name] is a 24 y.o. adult.  24 year old transgender here with nausea and vomiting that started this morning.  Has not eaten in a couple of days.  Last alcohol was 2 days ago.  Initial blood sugar 64 given D10 with improvement.  Also complaining of a headache.  Homeless.  The history is provided by the patient.  Emesis Severity:  Moderate Timing:  Intermittent Quality:  Stomach contents Progression:  Unchanged Chronicity:  New Recent urination:  Normal Relieved by:  None tried Worsened by:  Nothing Ineffective treatments:  None tried Associated symptoms: headaches   Associated symptoms: no abdominal pain, no cough, no diarrhea, no fever and no sore throat   Risk factors: alcohol use        Past Medical History:  Diagnosis Date  . ADHD (attention deficit hyperactivity disorder)   . Bipolar 1 disorder ([place])   . Depression   . Migraine   . PTSD (post-traumatic stress disorder)   . Sickle cell trait [facility])     Patient Active Problem List   Diagnosis Date Noted  . Chest wall pain 05/31/2016  . Fever 05/28/2016  . Adjustment disorder with depressed mood 04/25/2016  . Attention deficit hyperactivity disorder (ADHD) 04/25/2016    Past Surgical History:  Procedure Laterality Date  . KNEE SURGERY    . WISDOM TOOTH EXTRACTION         Family History  Problem Relation Age of Onset  . Sickle cell trait Father     Social History   Tobacco Use  . Smoking status: Current Every Day Smoker    Packs/day: 1.00  . Smokeless tobacco: Never Used  Substance Use Topics  . Alcohol use: Yes  . Drug use: Yes    Types: Marijuana    Home Medications Prior to Admission medications   Medication Sig Start Date End Date Taking? Authorizing Provider  ondansetron (ZOFRAN ODT) 4  MG disintegrating tablet Take 1 tablet (4 mg total) by mouth every 8 (eight) hours as needed for nausea or vomiting. Patient not taking: Reported on 09/07/2019 07/23/19   Tedd Sias, PA    Allergies    Shellfish allergy  Review of Systems   Review of Systems  Constitutional: Negative for fever.  HENT: Negative for sore throat.   Eyes: Negative for visual disturbance.  Respiratory: Negative for cough and shortness of breath.   Cardiovascular: Negative for chest pain.  Gastrointestinal: Positive for nausea and vomiting. Negative for abdominal pain and diarrhea.  Genitourinary: Negative for dysuria.  Musculoskeletal: Negative for neck pain.  Skin: Negative for rash.  Neurological: Positive for headaches.    Physical Exam Updated Vital Signs BP (!) 142/106 (BP Location: Left Arm)   Pulse (!) 103   Temp 98.5 F (36.9 C) (Oral)   Resp 19   SpO2 100%   Physical Exam Vitals and nursing note reviewed.  Constitutional:      Appearance: She is well-developed.     Comments: unkempt  HENT:     Head: Normocephalic and atraumatic.  Eyes:     Conjunctiva/sclera: Conjunctivae normal.  Cardiovascular:     Rate and Rhythm: Regular rhythm. Tachycardia present.     Heart sounds: No murmur.  Pulmonary:     Effort: Pulmonary effort is normal. No  respiratory distress.     Breath sounds: Normal breath sounds.  Abdominal:     Palpations: Abdomen is soft.     Tenderness: There is no abdominal tenderness.  Musculoskeletal:        General: No deformity or signs of injury. Normal range of motion.     Cervical back: Neck supple.  Skin:    General: Skin is warm and dry.     Capillary Refill: Capillary refill takes less than 2 seconds.  Neurological:     General: No focal deficit present.     Mental Status: She is alert.     GCS: GCS eye subscore is 4. GCS verbal subscore is 5. GCS motor subscore is 6.     ED Results / Procedures / Treatments   Labs (all labs ordered are listed, but  only abnormal results are displayed) Labs Reviewed  COMPREHENSIVE METABOLIC PANEL - Abnormal; Notable for the following components:      Result Value   CO2 16 (*)    Glucose, Bld 175 (*)    Calcium 8.5 (*)    AST 51 (*)    Anion gap 19 (*)    All other components within normal limits  URINALYSIS, ROUTINE W REFLEX MICROSCOPIC - Abnormal; Notable for the following components:   Glucose, UA >=500 (*)    Ketones, ur 20 (*)    Protein, ur 30 (*)    Bacteria, UA RARE (*)    All other components within normal limits  LIPASE, BLOOD  CBC    EKG None  Radiology No results found.  Procedures Procedures (including critical care time)  Medications Ordered in ED Medications  sodium chloride 0.9 % bolus 1,000 mL (0 mLs Intravenous Stopped 10/16/19 1150)    ED Course  I have reviewed the triage vital signs and the nursing notes.  Pertinent labs & imaging results that were available during my care of the patient were reviewed by me and considered in my medical decision making (see chart for details).  Clinical Course as of Oct 15 1752  Wed Oct 16, 2019  1015 Differential diagnosis includes malnutrition, gastroenteritis, cholecystitis, appendicitis, dehydration.  Getting IV fluids and some screening labs.  Already received some Zofran by EMS.   [MB]  1102 Patient was able to eat here without any difficulty.  Labs show normal white count normal hemoglobin.  Chemistries with low bicarb likely due to dehydration.  Glucose elevated at 175 after glucose administration by EMS.  AST mildly up at 51 likely related to alcohol use.  Lipase normal.  Urinalysis showing some ketones and some glucose with no signs of infection.   [MB]  1103 Patient has tolerated liquids and food here with improvement in symptoms.  Will discharge on some nausea medication.  Return instructions discussed   [MB]    Clinical Course User Index [MB] Terrilee Files, MD   MDM Rules/Calculators/A&P                        Final Clinical Impression(s) / ED Diagnoses Final diagnoses:  Nausea and vomiting in adult  Hypoglycemia    Rx / DC Orders ED Discharge Orders         Ordered    ondansetron (ZOFRAN ODT) 4 MG disintegrating tablet  Every 8 hours PRN     10/16/19 1104           Terrilee Files, MD 10/16/19 1754

## 2019-10-16 NOTE — H&P (Signed)
Behavioral Health Medical Screening Exam  Guy [name] is an 24 y.o. adult. She has history of alcohol use disorder and homelessness with 10 ED visits since February 2021. She was just discharged from the ED this morning and now presenting to [facility]. She denies SI/HI/AVH and shows no signs of responding to internal stimuli. She is requesting detox from alcohol.  Total Time spent with patient: 15 minutes  Psychiatric Specialty Exam: Physical Exam  Nursing note and vitals reviewed. Constitutional: She is oriented to person, place, and time. She appears well-developed and well-nourished.  Cardiovascular: Normal rate.  Respiratory: Effort normal.  Neurological: She is alert and oriented to person, place, and time.    Review of Systems  Constitutional: Negative.   Respiratory: Negative for cough and shortness of breath.   Gastrointestinal: Positive for nausea. Negative for vomiting.  Neurological: Negative for tremors.  Psychiatric/Behavioral: Negative for agitation, behavioral problems, confusion, dysphoric mood, hallucinations, self-injury, sleep disturbance and suicidal ideas. The patient is not nervous/anxious and is not hyperactive.     Blood pressure (!) 143/86, pulse (!) 105, temperature 98.4 F (36.9 C), temperature source Oral, resp. rate 18, SpO2 98 %.There is no height or weight on file to calculate BMI.  General Appearance: Disheveled  Eye Contact:  Good  Speech:  Normal Rate  Volume:  Normal  Mood:  Anxious  Affect:  Congruent  Thought Process:  Coherent  Orientation:  Full (Time, Place, and Person)  Thought Content:  Logical  Suicidal Thoughts:  No  Homicidal Thoughts:  No  Memory:  Immediate;   Fair Recent;   Fair  Judgement:  Intact  Insight:  Lacking  Psychomotor Activity:  Decreased  Concentration: Concentration: Fair and Attention Span: Fair  Recall:  [employer] of Knowledge:Fair  Language: Fair  Akathisia:  No  Handed:  Right  AIMS (if indicated):      Assets:  Communication Skills Leisure Time  Sleep:       Musculoskeletal: Strength & Muscle Tone: within normal limits Gait & Station: normal Patient leans: N/A  Blood pressure (!) 143/86, pulse (!) 105, temperature 98.4 F (36.9 C), temperature source Oral, resp. rate 18, SpO2 98 %.  Recommendations:  Based on my evaluation the patient does not appear to have an emergency medical condition.  Referrals to detox/substance use treatment.  Aldean Baker, NP 10/16/2019, 2:56 PM

## 2019-10-16 NOTE — Discharge Instructions (Addendum)
You were seen in the emergency department for nausea and vomiting.  Your lab work was fairly unremarkable other than showing some signs of dehydration.  Please try to get more fluids and you and start with a bland diet advancing as tolerated.  Return to the emergency department if any fever or worsening symptoms.

## 2019-10-16 NOTE — ED Notes (Signed)
Patient asking for something to eat.  Per Dr. Charm Barges can have food.  Sandwich tray given.

## 2019-11-20 ENCOUNTER — Emergency Department (HOSPITAL_COMMUNITY)
Admission: EM | Admit: 2019-11-20 | Discharge: 2019-11-21 | Disposition: A | Discharge status: Home / Self Care | Payer: Self-pay | Attending: Emergency Medicine | Admitting: Emergency Medicine

## 2019-11-20 ENCOUNTER — Other Ambulatory Visit: Payer: Self-pay

## 2019-11-20 DIAGNOSIS — Z5321 Procedure and treatment not carried out due to patient leaving prior to being seen by health care provider: Secondary | ICD-10-CM | POA: Insufficient documentation

## 2019-11-20 DIAGNOSIS — D5700 Hb-SS disease with crisis, unspecified: Secondary | ICD-10-CM | POA: Insufficient documentation

## 2019-11-20 DIAGNOSIS — Z590 Homelessness: Secondary | ICD-10-CM | POA: Insufficient documentation

## 2019-11-20 LAB — CBC WITH DIFFERENTIAL/PLATELET
Abs Immature Granulocytes: 0.01 10*3/uL (ref 0.00–0.07)
Basophils Absolute: 0 10*3/uL (ref 0.0–0.1)
Basophils Relative: 1 %
Eosinophils Absolute: 0 10*3/uL (ref 0.0–0.5)
Eosinophils Relative: 1 %
HCT: 45.2 % (ref 39.0–52.0)
Hemoglobin: 15.1 g/dL (ref 13.0–17.0)
Immature Granulocytes: 0 %
Lymphocytes Relative: 30 %
Lymphs Abs: 1.2 10*3/uL (ref 0.7–4.0)
MCH: 28.9 pg (ref 26.0–34.0)
MCHC: 33.4 g/dL (ref 30.0–36.0)
MCV: 86.6 fL (ref 80.0–100.0)
Monocytes Absolute: 0.4 10*3/uL (ref 0.1–1.0)
Monocytes Relative: 9 %
Neutro Abs: 2.4 10*3/uL (ref 1.7–7.7)
Neutrophils Relative %: 59 %
Platelets: 237 10*3/uL (ref 150–400)
RBC: 5.22 MIL/uL (ref 4.22–5.81)
RDW: 13.2 % (ref 11.5–15.5)
WBC: 4.1 10*3/uL (ref 4.0–10.5)
nRBC: 0 % (ref 0.0–0.2)

## 2019-11-20 LAB — COMPREHENSIVE METABOLIC PANEL
ALT: 24 U/L (ref 0–44)
AST: 36 U/L (ref 15–41)
Albumin: 4.6 g/dL (ref 3.5–5.0)
Alkaline Phosphatase: 62 U/L (ref 38–126)
Anion gap: 14 (ref 5–15)
BUN: 8 mg/dL (ref 6–20)
CO2: 24 mmol/L (ref 22–32)
Calcium: 9.2 mg/dL (ref 8.9–10.3)
Chloride: 100 mmol/L (ref 98–111)
Creatinine, Ser: 1.06 mg/dL (ref 0.61–1.24)
GFR calc Af Amer: >60 mL/min (ref >60)
GFR calc non Af Amer: >60 mL/min (ref >60)
Glucose, Bld: 82 mg/dL (ref 70–99)
Potassium: 3.8 mmol/L (ref 3.5–5.1)
Sodium: 138 mmol/L (ref 135–145)
Total Bilirubin: 1.6 mg/dL — ABNORMAL HIGH (ref 0.3–1.2)
Total Protein: 7.3 g/dL (ref 6.5–8.1)

## 2019-11-20 LAB — RETICULOCYTES
Immature Retic Fract: 2.8 % (ref 2.3–15.9)
RBC.: 5.16 MIL/uL (ref 4.22–5.81)
Retic Count, Absolute: 33.5 10*3/uL (ref 19.0–186.0)
Retic Ct Pct: 0.7 % (ref 0.4–3.1)

## 2019-11-20 MED ORDER — SODIUM CHLORIDE 0.9% FLUSH: 3.0000 mL | Freq: Once | INTRAVENOUS | Status: DC

## 2019-11-20 NOTE — ED Triage Notes (Signed)
Patient arrived by [facility] for sickle cell crisis-ptient refused [facility]. Patient states he is homeless and has no meds for same

## 2019-11-21 NOTE — ED Notes (Signed)
Pt states he is going to leave he is tired of waiting.

## 2019-12-10 ENCOUNTER — Emergency Department (HOSPITAL_COMMUNITY): Payer: Self-pay

## 2019-12-10 ENCOUNTER — Other Ambulatory Visit: Payer: Self-pay

## 2019-12-10 ENCOUNTER — Emergency Department (HOSPITAL_COMMUNITY)
Admission: EM | Admit: 2019-12-10 | Discharge: 2019-12-10 | Discharge status: Against Medical Advice | Payer: Self-pay | Attending: Emergency Medicine | Admitting: Emergency Medicine

## 2019-12-10 ENCOUNTER — Encounter (HOSPITAL_COMMUNITY): Payer: Self-pay | Admitting: Emergency Medicine

## 2019-12-10 DIAGNOSIS — F17200 Nicotine dependence, unspecified, uncomplicated: Secondary | ICD-10-CM | POA: Insufficient documentation

## 2019-12-10 DIAGNOSIS — F10920 Alcohol use, unspecified with intoxication, uncomplicated: Secondary | ICD-10-CM

## 2019-12-10 DIAGNOSIS — Z91013 Allergy to seafood: Secondary | ICD-10-CM | POA: Insufficient documentation

## 2019-12-10 DIAGNOSIS — F10929 Alcohol use, unspecified with intoxication, unspecified: Secondary | ICD-10-CM | POA: Insufficient documentation

## 2019-12-10 DIAGNOSIS — R519 Headache, unspecified: Secondary | ICD-10-CM | POA: Insufficient documentation

## 2019-12-10 NOTE — ED Triage Notes (Addendum)
Per EMS-[employer] called EMS-saw patient laying on ground intoxicated-officer on scene witnessed patient stumble to ground-small contusion to back of head
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
## 2020-01-16 ENCOUNTER — Other Ambulatory Visit: Payer: Self-pay

## 2020-01-16 ENCOUNTER — Emergency Department (HOSPITAL_COMMUNITY)
Admission: EM | Admit: 2020-01-16 | Discharge: 2020-01-16 | Disposition: A | Discharge status: Home / Self Care | Payer: Medicaid Other | Attending: Emergency Medicine | Admitting: Emergency Medicine

## 2020-01-16 DIAGNOSIS — Z5321 Procedure and treatment not carried out due to patient leaving prior to being seen by health care provider: Secondary | ICD-10-CM | POA: Insufficient documentation

## 2020-01-16 DIAGNOSIS — D57219 Sickle-cell/Hb-C disease with crisis, unspecified: Secondary | ICD-10-CM | POA: Insufficient documentation

## 2020-01-16 NOTE — ED Triage Notes (Signed)
Pt decided to leave from triage right after vital signs were done, he states that it was too much for him and he was just leaving

## 2020-02-06 ENCOUNTER — Other Ambulatory Visit: Payer: Self-pay | Admitting: *Deleted

## 2020-02-06 NOTE — Patient Outreach (Signed)
[facility]) Care Management  02/06/2020  GENE GLAZEBROOK [birth year] [id number]   Referral Received 8/11 Initial Outreach 8/12  RN attempted outreach call however unsuccessful. RN able to leave a HIPAA approved voice message requesting a call back.   Plan: Will also send an outreach letter and scheduled another outreach call over the next week.  Elliot Cousin, RN Care Management Coordinator [facility] Main Office [phone]

## 2020-02-13 ENCOUNTER — Other Ambulatory Visit: Payer: Self-pay | Admitting: *Deleted

## 2020-02-13 NOTE — Patient Outreach (Addendum)
[facility]) Care Management  02/13/2020  Guy [name] 1996-02-19 [id number]   Telephone Assessment-Unsuccessful  RN attempted outreach call however only able to leave a HIPAA approved voice message requesting a call back.   Plan: Will  Plan another outreach call over the next week.  Elliot Cousin, RN Care Management Coordinator [facility] Main Office [phone]

## 2020-02-20 ENCOUNTER — Other Ambulatory Visit: Payer: Self-pay | Admitting: *Deleted

## 2020-02-20 NOTE — Patient Outreach (Signed)
[facility]) Care Management  02/20/2020  CURRAN LENDERMAN 1995/10/04 [id number]  Telephone Assessment-Unsuccessful  RN attempted outreach call however unsuccessful. RN able to leave a HIPAA approved voice message the only number available with a voice mail. No other number active for this pt and no response from the outreach letter send to this pt.   Plan: Will completed another outreach call in a few weeks prior to closure if unsuccessful with another follow up call.  Elliot Cousin, RN Care Management Coordinator [facility] Main Office [phone]

## 2020-03-03 ENCOUNTER — Encounter (HOSPITAL_COMMUNITY): Payer: Self-pay | Admitting: Emergency Medicine

## 2020-03-03 ENCOUNTER — Emergency Department (HOSPITAL_COMMUNITY)
Admission: EM | Admit: 2020-03-03 | Discharge: 2020-03-03 | Disposition: A | Discharge status: Home / Self Care | Payer: Medicaid Other | Attending: Emergency Medicine | Admitting: Emergency Medicine

## 2020-03-03 ENCOUNTER — Other Ambulatory Visit: Payer: Self-pay

## 2020-03-03 DIAGNOSIS — Z5321 Procedure and treatment not carried out due to patient leaving prior to being seen by health care provider: Secondary | ICD-10-CM | POA: Insufficient documentation

## 2020-03-03 DIAGNOSIS — R109 Unspecified abdominal pain: Secondary | ICD-10-CM | POA: Insufficient documentation

## 2020-03-03 NOTE — ED Triage Notes (Signed)
Per EMS-states he is here for abdominal pain-patient is demanding cheese-lives at [facility] not been taking meds

## 2020-03-18 ENCOUNTER — Emergency Department (HOSPITAL_COMMUNITY)
Admission: EM | Admit: 2020-03-18 | Discharge: 2020-03-18 | Disposition: A | Discharge status: Home / Self Care | Payer: Medicaid Other | Attending: Emergency Medicine | Admitting: Emergency Medicine

## 2020-03-18 ENCOUNTER — Other Ambulatory Visit: Payer: Self-pay

## 2020-03-18 ENCOUNTER — Encounter (HOSPITAL_COMMUNITY): Payer: Self-pay | Admitting: Emergency Medicine

## 2020-03-18 DIAGNOSIS — F10920 Alcohol use, unspecified with intoxication, uncomplicated: Secondary | ICD-10-CM

## 2020-03-18 DIAGNOSIS — F10120 Alcohol abuse with intoxication, uncomplicated: Secondary | ICD-10-CM | POA: Insufficient documentation

## 2020-03-18 DIAGNOSIS — F17210 Nicotine dependence, cigarettes, uncomplicated: Secondary | ICD-10-CM | POA: Insufficient documentation

## 2020-03-18 MED ORDER — ONDANSETRON 4 MG PO TBDP
4.0000 mg | ORAL_TABLET | Freq: Once | ORAL | Status: AC
  Administered 2020-03-18: 4 mg via ORAL
  Filled 2020-03-18: qty 1

## 2020-03-18 NOTE — ED Notes (Signed)
Pt provided with 3 cheese sticks and 2 warm blankets.

## 2020-03-18 NOTE — ED Notes (Signed)
Pt yelling at staff because we would not provide them transport. Attempted to explain to pt transport situation. Pt states, "shut the hell up bitch, I will walk myself." Pt walked out of ED.

## 2020-03-18 NOTE — ED Notes (Signed)
Pt is ambulatory with a steady gait to and from bathroom.
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
## 2020-03-18 NOTE — ED Notes (Signed)
Pt is sleeping in recliner comfortably.
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
## 2020-03-18 NOTE — Discharge Instructions (Signed)
Please avoid heavy alcohol use as it can severely impact your health.

## 2020-03-23 ENCOUNTER — Other Ambulatory Visit: Payer: Self-pay | Admitting: *Deleted

## 2020-03-23 NOTE — Patient Outreach (Signed)
[facility]) Care Management  03/23/2020  SCORPIO FORTIN Dec 30, 1995 [id number]   Telephone Screening-Unsuccessful  RN has made several outreach attempts to available contact numbers however unsuccessful  With HIPAA messages and no response to the outreach letters sent to this pt. Based upon the unsuccessful response will close this case from further outreach attempts.   Plan: Will close this case and notify the provider noted.  Elliot Cousin, RN Care Management Coordinator [facility] Main Office [phone]

## 2020-03-26 ENCOUNTER — Emergency Department (HOSPITAL_COMMUNITY): Payer: Self-pay

## 2020-03-26 ENCOUNTER — Encounter (HOSPITAL_COMMUNITY): Payer: Self-pay

## 2020-03-26 ENCOUNTER — Emergency Department (HOSPITAL_COMMUNITY)
Admission: EM | Admit: 2020-03-26 | Discharge: 2020-03-26 | Disposition: A | Discharge status: Home / Self Care | Payer: Self-pay | Attending: Emergency Medicine | Admitting: Emergency Medicine

## 2020-03-26 ENCOUNTER — Other Ambulatory Visit: Payer: Self-pay

## 2020-03-26 DIAGNOSIS — F17200 Nicotine dependence, unspecified, uncomplicated: Secondary | ICD-10-CM | POA: Insufficient documentation

## 2020-03-26 DIAGNOSIS — S161XXA Strain of muscle, fascia and tendon at neck level, initial encounter: Secondary | ICD-10-CM

## 2020-03-26 DIAGNOSIS — S66821A Laceration of other specified muscles, fascia and tendons at wrist and hand level, right hand, initial encounter: Secondary | ICD-10-CM

## 2020-03-26 DIAGNOSIS — R519 Headache, unspecified: Secondary | ICD-10-CM | POA: Insufficient documentation

## 2020-03-26 DIAGNOSIS — S61411A Laceration without foreign body of right hand, initial encounter: Secondary | ICD-10-CM | POA: Insufficient documentation

## 2020-03-26 DIAGNOSIS — S0990XA Unspecified injury of head, initial encounter: Secondary | ICD-10-CM

## 2020-03-26 MED ORDER — ONDANSETRON 8 MG PO TBDP
8.0000 mg | ORAL_TABLET | Freq: Once | ORAL | Status: AC
  Administered 2020-03-26: 8 mg via ORAL
  Filled 2020-03-26: qty 1

## 2020-03-26 NOTE — Discharge Instructions (Addendum)
Ibuprofen 600 mg every 6 hours as needed for pain.  Local wound care with bacitracin and dressing changes twice daily.  Follow-up with primary doctor if symptoms not improving in the next few days.

## 2020-03-26 NOTE — ED Notes (Signed)
Called pt for FT room and no response.  
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
## 2020-04-18 ENCOUNTER — Emergency Department (HOSPITAL_COMMUNITY)
Admission: EM | Admit: 2020-04-18 | Discharge: 2020-04-18 | Disposition: A | Discharge status: Home / Self Care | Payer: Medicaid Other | Attending: Emergency Medicine | Admitting: Emergency Medicine

## 2020-04-18 ENCOUNTER — Encounter (HOSPITAL_COMMUNITY): Payer: Self-pay | Admitting: Emergency Medicine

## 2020-04-18 ENCOUNTER — Other Ambulatory Visit: Payer: Self-pay

## 2020-04-18 DIAGNOSIS — F10920 Alcohol use, unspecified with intoxication, uncomplicated: Secondary | ICD-10-CM

## 2020-04-18 DIAGNOSIS — F10129 Alcohol abuse with intoxication, unspecified: Secondary | ICD-10-CM | POA: Insufficient documentation

## 2020-04-18 DIAGNOSIS — R45851 Suicidal ideations: Secondary | ICD-10-CM | POA: Insufficient documentation

## 2020-04-18 DIAGNOSIS — F17200 Nicotine dependence, unspecified, uncomplicated: Secondary | ICD-10-CM | POA: Insufficient documentation

## 2020-04-18 DIAGNOSIS — F1290 Cannabis use, unspecified, uncomplicated: Secondary | ICD-10-CM | POA: Insufficient documentation

## 2020-04-18 DIAGNOSIS — Z20822 Contact with and (suspected) exposure to covid-19: Secondary | ICD-10-CM | POA: Insufficient documentation

## 2020-04-18 DIAGNOSIS — F4321 Adjustment disorder with depressed mood: Secondary | ICD-10-CM | POA: Diagnosis present

## 2020-04-18 DIAGNOSIS — F332 Major depressive disorder, recurrent severe without psychotic features: Secondary | ICD-10-CM | POA: Insufficient documentation

## 2020-04-18 LAB — COMPREHENSIVE METABOLIC PANEL
ALT: 19 U/L (ref 0–44)
AST: 30 U/L (ref 15–41)
Albumin: 4.7 g/dL (ref 3.5–5.0)
Alkaline Phosphatase: 72 U/L (ref 38–126)
Anion gap: 15 (ref 5–15)
BUN: 11 mg/dL (ref 6–20)
CO2: 23 mmol/L (ref 22–32)
Calcium: 8.4 mg/dL — ABNORMAL LOW (ref 8.9–10.3)
Chloride: 100 mmol/L (ref 98–111)
Creatinine, Ser: 0.92 mg/dL (ref 0.61–1.24)
GFR, Estimated: >60 mL/min (ref >60)
Glucose, Bld: 107 mg/dL — ABNORMAL HIGH (ref 70–99)
Potassium: 3.9 mmol/L (ref 3.5–5.1)
Sodium: 138 mmol/L (ref 135–145)
Total Bilirubin: 0.6 mg/dL (ref 0.3–1.2)
Total Protein: 7.9 g/dL (ref 6.5–8.1)

## 2020-04-18 LAB — CBC WITH DIFFERENTIAL/PLATELET
Abs Immature Granulocytes: 0.02 10*3/uL (ref 0.00–0.07)
Basophils Absolute: 0 10*3/uL (ref 0.0–0.1)
Basophils Relative: 1 %
Eosinophils Absolute: 0.1 10*3/uL (ref 0.0–0.5)
Eosinophils Relative: 2 %
HCT: 45.4 % (ref 39.0–52.0)
Hemoglobin: 15 g/dL (ref 13.0–17.0)
Immature Granulocytes: 0 %
Lymphocytes Relative: 32 %
Lymphs Abs: 1.8 10*3/uL (ref 0.7–4.0)
MCH: 29.2 pg (ref 26.0–34.0)
MCHC: 33 g/dL (ref 30.0–36.0)
MCV: 88.5 fL (ref 80.0–100.0)
Monocytes Absolute: 0.5 10*3/uL (ref 0.1–1.0)
Monocytes Relative: 9 %
Neutro Abs: 3.1 10*3/uL (ref 1.7–7.7)
Neutrophils Relative %: 56 %
Platelets: 202 10*3/uL (ref 150–400)
RBC: 5.13 MIL/uL (ref 4.22–5.81)
RDW: 13.2 % (ref 11.5–15.5)
WBC: 5.5 10*3/uL (ref 4.0–10.5)
nRBC: 0 % (ref 0.0–0.2)

## 2020-04-18 LAB — RESPIRATORY PANEL BY RT PCR (FLU A&B, COVID)
Influenza A by PCR: NEGATIVE
Influenza B by PCR: NEGATIVE
SARS Coronavirus 2 by RT PCR: NEGATIVE

## 2020-04-18 MED ORDER — LORAZEPAM 1 MG PO TABS: 0.0000 mg | ORAL_TABLET | Freq: Two times a day (BID) | ORAL | Status: DC

## 2020-04-18 MED ORDER — LORAZEPAM 2 MG/ML IJ SOLN: 0.0000 mg | Freq: Four times a day (QID) | INTRAMUSCULAR | Status: DC

## 2020-04-18 MED ORDER — ALUM & MAG HYDROXIDE-SIMETH 200-200-20 MG/5ML PO SUSP
30.0000 mL | Freq: Once | ORAL | Status: AC
  Administered 2020-04-18: 30 mL via ORAL
  Filled 2020-04-18: qty 30

## 2020-04-18 MED ORDER — ONDANSETRON 4 MG PO TBDP
4.0000 mg | ORAL_TABLET | Freq: Once | ORAL | Status: AC
  Administered 2020-04-18: 4 mg via ORAL
  Filled 2020-04-18: qty 1

## 2020-04-18 MED ORDER — LORAZEPAM 2 MG/ML IJ SOLN: 0.0000 mg | Freq: Two times a day (BID) | INTRAMUSCULAR | Status: DC

## 2020-04-18 MED ORDER — LIDOCAINE VISCOUS HCL 2 % MT SOLN
15.0000 mL | Freq: Once | OROMUCOSAL | Status: AC
  Administered 2020-04-18: 15 mL via ORAL
  Filled 2020-04-18: qty 15

## 2020-04-18 MED ORDER — THIAMINE HCL 100 MG PO TABS
100.0000 mg | ORAL_TABLET | Freq: Every day | ORAL | Status: DC
  Administered 2020-04-18: 100 mg via ORAL
  Filled 2020-04-18: qty 1

## 2020-04-18 MED ORDER — PANTOPRAZOLE SODIUM 40 MG PO TBEC
40.0000 mg | DELAYED_RELEASE_TABLET | Freq: Every day | ORAL | Status: DC
  Administered 2020-04-18: 40 mg via ORAL
  Filled 2020-04-18: qty 1

## 2020-04-18 MED ORDER — LORAZEPAM 1 MG PO TABS: 0.0000 mg | ORAL_TABLET | Freq: Four times a day (QID) | ORAL | Status: DC

## 2020-04-18 NOTE — ED Notes (Signed)
Patient states he is trying to get to class. Patient is alert to verbal stimuli but not oriented

## 2020-04-18 NOTE — Progress Notes (Signed)
CSW received consult for patient for homelessness and substance use - patient is active with the [facility] for case management services and is pursuing housing with the [facility]. Patient is to see peer support prior to discharge to address his substance use.  CSW added substance use resources to his AVS for reference after discharge.  Edwin Dada, MSW, LCSW-A Transitions of Care  Clinical Social Worker  [facility] Emergency Departments  Medical ICU [phone]

## 2020-04-18 NOTE — ED Notes (Signed)
Pt refused a shower.

## 2020-04-18 NOTE — ED Provider Notes (Signed)
Please see previous note for further details.  In short this patient had suicidal ideations was awaiting psychiatric disposition.  He has been seen and evaluated by psychiatry they recommended discharge.  They are sending patient to the shelter and giving him polysubstance abuse resources.  He was previously medically cleared.  3 PM: Patient reassessed he is sleeping comfortably no acute distress, easily arousable to voice requesting more food before leaving.  At this time there does not appear to be any evidence of an acute emergency medical condition and the patient appears stable for discharge with appropriate outpatient follow up. Diagnosis was discussed with patient who verbalizes understanding of care plan and is agreeable to discharge. I have discussed return precautions with patient who verbalizes understanding. Patient encouraged to follow-up with their resources. All questions answered.  Patient's case discussed with Dr. Effie Shy who agrees with plan to discharge with follow-up.   Note: Portions of this report may have been transcribed using voice recognition software. Every effort was made to ensure accuracy; however, inadvertent computerized transcription errors may still be present.   Bill Salinas, PA-C 04/18/20 1503    Mancel Bale, MD 04/19/20 [phone]

## 2020-04-18 NOTE — ED Notes (Signed)
He arouses easily and is calm and cooperative.

## 2020-04-18 NOTE — Discharge Instructions (Signed)
At this time there does not appear to be the presence of an emergent medical condition, however there is always the potential for conditions to change. Please read and follow the below instructions. ° °Please return to the Emergency Department immediately for any new or worsening symptoms. °Please be sure to follow up with your Primary Care Provider within one week regarding your visit today; please call their office to schedule an appointment even if you are feeling better for a follow-up visit. ° °Please read the additional information packets attached to your discharge summary. ° °Do not take your medicine if  develop an itchy rash, swelling in your mouth or lips, or difficulty breathing; call 911 and seek immediate emergency medical attention if this occurs. ° °You may review your lab tests and imaging results in their entirety on your MyChart account.  Please discuss all results of fully with your primary care provider and other specialist at your follow-up visit. ° °Note: Portions of this text may have been transcribed using voice recognition software. Every effort was made to ensure accuracy; however, inadvertent computerized transcription errors may still be present. °

## 2020-04-18 NOTE — ED Notes (Signed)
LUNCH TRAY GIVEN. 

## 2020-04-18 NOTE — ED Triage Notes (Addendum)
Patient is complaining of sickle cell pain crisis. Patient is complaining of chest pain and back pain. Patient is very sleepy. Patient states he has not slept for 3 days. Patient does not have any hx with having sickle cell. Patient has trait.

## 2020-04-18 NOTE — BH Assessment (Signed)
Assessment Note  Guy [name] is an 24 y.o. adult that presented earlier this date with S/I although denies at the time of assessment. Patient denies any H/I or AVH. Patient denies any previous attempts or gestures. Patient denies access to weapons and is currently on probation. Patient has pending charges for trespassing also with his court date being on 05/18/20. Marland Kitchen Patient is currently homeless reporting that is his primary stressor. Patient states he currently receives services from [facility] where he has a case manager who assists with ongoing needs to include medication management for depression. Patient reports current compliance and feels his medications are working as indicated. Patient states he started having thoughts of self harm yesterday when someone at the [facility] started talking about his deceased brother. Patient is currently contracting for safety and denies any thoughts of self harm. Patient per chart review was last seen on 10/16/19 when he presented requesting alcohol detox. Patient reports ongoing SA issues to include: using alcohol in various amounts two to three times a week with last use prior to arrival when he reports he "had a few beers." Patient also reports cannabis use two to three times a week also with last use "a couple days ago." Patient's UDS is pending. Patient denies current or previous SI, any previous attempts to kill himself, or a current plan to kill himself. He also denies HI, AVH, NSSIB, and access to guns/weapons.   Patient states he is currently homeless, which he lists as a stressor. He states he is not currently attending school or working.   Patient is oriented x4. His recent and remote memory is intact. Patient was cooperative throughout the assessment process. Patient's insight is fair; his judgement and impulse control is fair at this time. Case was staffed with Arlana Pouch NP (see Arlana Pouch NP note) and was recommended for discharge. Patient will meet with peer support and  social work to address current needs prior to discharge.    .  Diagnosis: MDD recurrent without psychotic features, severe, Alcohol abuse, Cannabis use   Past Medical History:  Past Medical History:  Diagnosis Date  . ADHD (attention deficit hyperactivity disorder)   . Bipolar 1 disorder (HCC)   . Depression   . Migraine   . PTSD (post-traumatic stress disorder)   . Sickle cell trait [facility])     Past Surgical History:  Procedure Laterality Date  . KNEE SURGERY    . WISDOM TOOTH EXTRACTION      Family History:  Family History  Problem Relation Age of Onset  . Sickle cell trait Father     Social History:  reports that she has been smoking. She has been smoking about 1.00 pack per day. She has never used smokeless tobacco. She reports current alcohol use. She reports current drug use. Drug: Marijuana.  Additional Social History:  Alcohol / Drug Use Pain Medications: See MAR Prescriptions: See MAR Over the Counter: See MAR History of alcohol / drug use?: Yes Longest period of sobriety (when/how long): Unknown Negative Consequences of Use:  (Denies) Withdrawal Symptoms:  (Denies) Substance #1 Name of Substance 1: Cannabis use Substance #2 Name of Substance 2: Alcohol 2 - Age of First Use: 16 2 - Amount (size/oz): Varies 2 - Frequency: Varies 2 - Duration: Ongoing 2 - Last Use / Amount: 04/17/20 unknown amount  CIWA: CIWA-Ar BP: 125/83 Pulse Rate: (!) 109 COWS:    Allergies:  Allergies  Allergen Reactions  . Shellfish Allergy Anaphylaxis    Home Medications: (Not  in a hospital admission)   OB/GYN Status:  No LMP recorded.  General Assessment Data [facility] of Assessment: [facility] TTS Assessment: In system Is this a Tele or Face-to-Face Assessment?: Face-to-Face Is this an Initial Assessment or a Re-assessment for this encounter?: Initial Assessment Patient Accompanied by:: N/A Language Other than English: No Living Arrangements: Homeless/Shelter What  gender do you identify as?: Male (Pt is transgender although still identifies as male) Date Telepsych consult ordered in [facility]: 04/18/20 Marital status: Single Living Arrangements: Alone Can pt return to current living arrangement?: Yes Admission Status: Voluntary Is patient capable of signing voluntary admission?: Yes Referral Source: Self/Family/Friend Insurance type: SP  Medical Screening Exam [facility] Walk-in ONLY) Medical Exam completed: Yes  Crisis Care Plan Living Arrangements: Alone Legal Guardian:  (NA) Name of Psychiatrist: None Name of Therapist: None  Education Status Is patient currently in school?: No Is the patient employed, unemployed or receiving disability?: Employed  Risk to self with the past 6 months Suicidal Ideation: No Has patient been a risk to self within the past 6 months prior to admission? : No Suicidal Intent: No Has patient had any suicidal intent within the past 6 months prior to admission? : No Is patient at risk for suicide?: No Suicidal Plan?: No Has patient had any suicidal plan within the past 6 months prior to admission? : No Access to Means: No What has been your use of drugs/alcohol within the last 12 months?: Current use Previous Attempts/Gestures: Yes How many times?: 1 Other Self Harm Risks:  (Homeless) Triggers for Past Attempts: Unknown Intentional Self Injurious Behavior: None Family Suicide History: No Recent stressful life event(s): Other (Comment) (Homeless) Persecutory voices/beliefs?: No Depression: Yes Depression Symptoms: Feeling worthless/self pity Substance abuse history and/or treatment for substance abuse?: No Suicide prevention information given to non-admitted patients: Not applicable  Risk to Others within the past 6 months Homicidal Ideation: No Does patient have any lifetime risk of violence toward others beyond the six months prior to admission? : No Thoughts of Harm to Others: No Current Homicidal Intent:  No Current Homicidal Plan: No Access to Homicidal Means: No Identified Victim: NA History of harm to others?: No Assessment of Violence: None Noted Violent Behavior Description: NA Does patient have access to weapons?: No Criminal Charges Pending?: Yes Describe Pending Criminal Charges: Trespassing Does patient have a court date: Yes Court Date: 05/18/20 Is patient on probation?: Yes  Psychosis Hallucinations: None noted Delusions: None noted  Mental Status Report Appearance/Hygiene: Unremarkable Eye Contact: Fair Motor Activity: Freedom of movement Speech: Logical/coherent Level of Consciousness: Quiet/awake Mood: Pleasant Affect: Appropriate to circumstance Anxiety Level: Minimal Thought Processes: Coherent, Relevant Judgement: Partial Orientation: Person, Place, Time Obsessive Compulsive Thoughts/Behaviors: None  Cognitive Functioning Concentration: Normal Memory: Recent Intact, Remote Intact Is patient IDD: No Insight: Fair Impulse Control: Fair Appetite: Good Have you had any weight changes? : No Change Sleep: No Change Total Hours of Sleep: 7 Vegetative Symptoms: None  ADLScreening [facility] Assessment Services) Patient's cognitive ability adequate to safely complete daily activities?: Yes Patient able to express need for assistance with ADLs?: Yes Independently performs ADLs?: Yes (appropriate for developmental age)  Prior Inpatient Therapy Prior Inpatient Therapy: No  Prior Outpatient Therapy Prior Outpatient Therapy: No Does patient have an ACCT team?: No Does patient have Intensive In-House Services?  : No Does patient have [facility] services? : No Does patient have P4CC services?: No  ADL Screening (condition at time of admission) Patient's cognitive ability adequate to safely complete daily  activities?: Yes Is the patient deaf or have difficulty hearing?: No Does the patient have difficulty seeing, even when wearing glasses/contacts?: No Does the  patient have difficulty concentrating, remembering, or making decisions?: No Patient able to express need for assistance with ADLs?: Yes Does the patient have difficulty dressing or bathing?: No Independently performs ADLs?: Yes (appropriate for developmental age) Does the patient have difficulty walking or climbing stairs?: No Weakness of Legs: None Weakness of Arms/Hands: None  Home Assistive Devices/Equipment Home Assistive Devices/Equipment: None  Therapy Consults (therapy consults require a physician order) PT Evaluation Needed: No OT Evalulation Needed: No SLP Evaluation Needed: No Abuse/Neglect Assessment (Assessment to be complete while patient is alone) Abuse/Neglect Assessment Can Be Completed: Yes Physical Abuse: Denies Verbal Abuse: Denies Sexual Abuse: Denies Exploitation of patient/patient's resources: Denies Self-Neglect: Denies Values / Beliefs Cultural Requests During Hospitalization: None Spiritual Requests During Hospitalization: None Consults Spiritual Care Consult Needed: No Transition of Care Team Consult Needed: No Advance Directives (For Healthcare) Does Patient Have a Medical Advance Directive?: No Would patient like information on creating a medical advance directive?: No - Patient declined          Disposition: Case was staffed with Arlana Pouch NP (see Arlana Pouch NP note) and was recommended for discharge. Patient will meet with peer support and social work to address current needs prior to discharge.    Disposition Initial Assessment Completed for this Encounter: Yes Disposition of Patient: Discharge  On Site Evaluation by:   Reviewed with Physician:    Alfredia Ferguson 04/18/2020 12:11 PM

## 2020-04-18 NOTE — ED Notes (Signed)
Pt informed me once I entered the room that they felt suicidal and wanted to die. The pt said they have felt this way since their brother died in 02-24-2023. The pt admitted to drinking today as well.

## 2020-04-18 NOTE — ED Notes (Signed)
PT RECEIVED BREAKFAST TRAY

## 2020-04-18 NOTE — ED Provider Notes (Signed)
Care handoff received from [facility] PA-C at shift change please see previous providers note for full details of visit.  In short 24 year old male presented to the ED intoxicated complaining of sickle cell pain however he does not have sickle cell anemia.  After he did not receive pain medication he began complaining of suicidal ideations.  His brother passed away around 2 months ago and he has not had psychiatric evaluation since that time.  Basic labs CBC and CMP obtained by previous team are reassuring; no leukocytosis or anemia, no emergent Electra derangement, AKI, LFT elevations or gap.  Covid test is pending.  Plan of care is to await Covid test if negative patient can be transferred to [facility].  GI cocktail, Protonix and Zofran were given for abdominal pain.  Physical Exam  BP 108/70    Pulse 81    Temp 98.1 F (36.7 C)    Resp 15    Ht 5\' 4"  (1.626 m)    Wt 68 kg    SpO2 96%    BMI 25.75 kg/m   Physical Exam Constitutional:      General: She is not in acute distress.    Appearance: Normal appearance. She is well-developed. She is not ill-appearing or diaphoretic.  HENT:     Head: Normocephalic and atraumatic.  Eyes:     General: Vision grossly intact. Gaze aligned appropriately.     Pupils: Pupils are equal, round, and reactive to light.  Neck:     Trachea: Trachea and phonation normal.  Pulmonary:     Effort: Pulmonary effort is normal. No respiratory distress.  Abdominal:     General: There is no distension.     Palpations: Abdomen is soft.     Tenderness: There is no abdominal tenderness. There is no guarding or rebound.  Musculoskeletal:        General: Normal range of motion.     Cervical back: Normal range of motion.  Skin:    General: Skin is warm and dry.  Neurological:     Mental Status: She is alert.     GCS: GCS eye subscore is 4. GCS verbal subscore is 5. GCS motor subscore is 6.     Comments: Speech is clear and goal oriented, follows commands Major Cranial  nerves without deficit, no facial droop Moves extremities without ataxia, coordination intact  Psychiatric:        Behavior: Behavior normal.     ED Course/Procedures     Procedures  MDM  Covid/influenza panel negative. - 10:45 AM: Patient reassessed, sleeping comfortably no acute distress easily arousable to voice.  Reports that they just ate breakfast and are feeling well, denies any concerns.  Patient reports feeling better since GI cocktail.  Reexamination of the abdomen unremarkable, no signs of acute abdomen.  As patient tolerating p.o. reports they are feeling better there is no indication for further medical work-up at this time.  Agree with previous team member patient remains medically cleared and is stable for psychiatric disposition. - 10:50 AM: Spoke with at behavioral health, advises patient cannot be transferred to [facility] until TTS consult is completed. I placed TTS consult.  Patient placed in psych hold with CIWA.  Note: Portions of this report may have been transcribed using voice recognition software. Every effort was made to ensure accuracy; however, inadvertent computerized transcription errors may still be present.   [facility], PA-C 04/18/20 1059    04/20/20, MD 04/19/20 [phone]

## 2020-04-18 NOTE — Consult Note (Signed)
[facility] Psych ED Discharge  04/18/2020 11:37 AM Guy [name]  MRN:  [id number] Principal Problem: Adjustment disorder with depressed mood Discharge Diagnoses: Principal Problem:   Adjustment disorder with depressed mood   Subjective: Patient states "yesterday somebody was talking about my brother who died and then I felt suicidal."  Patient denies suicidal ideations currently.  Patient contracts verbally for safety with this Clinical research associate.  Patient denies self-harm behaviors.  Patient endorses history of suicide attempts but does not report number.  Patient reports he is currently homeless in [place] and has remained homeless for approximately 4 years.  Patient reports he is working with a Sports coach, Pensions consultant at [employer].  Patient reports he ordered a new ID card on yesterday and he is seeking housing through [facility].  Patient reports he would like to relocate to [place] where his mother resides.  Patient reports he is currently on supervised probation and cannot move away from [place] currently.  Patient reports he is currently on probation for larceny.  Patient also reports he will be going to court on November 22 for trespassing.  Patient denies access to weapons.  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  Patient endorses occasional alcohol use and marijuana use once monthly.  Patient denies substance use aside from marijuana.  Patient reports he has been diagnosed with ADHD in the past and he currently is compliant with home medications including Depakote and Ritalin.  Patient reports he receives these medications through the [facility] care manager.  Patient reports average sleep and appetite.  Patient assessed by nurse practitioner.  Patient alert and oriented, answers appropriately.  Patient pleasant cooperative during assessment.  Patient denies homicidal ideations.  Patient denies auditory and visual hallucinations.  There is  no evidence of delusional thought content and patient does not appear to be responding to internal stimuli.  Patient denies symptoms of paranoia.  Patient offered support and encouragement.  Patient reports he would like to meet with social work to discuss housing options.  Patient agrees with plan to discharge to [facility] or [facility] today.  Total Time spent with patient: 30 minutes  Past Psychiatric History: ADHD, adjustment disorder with depressed mood  Past Medical History:  Past Medical History:  Diagnosis Date  . ADHD (attention deficit hyperactivity disorder)   . Bipolar 1 disorder (HCC)   . Depression   . Migraine   . PTSD (post-traumatic stress disorder)   . Sickle cell trait [facility])     Past Surgical History:  Procedure Laterality Date  . KNEE SURGERY    . WISDOM TOOTH EXTRACTION     Family History:  Family History  Problem Relation Age of Onset  . Sickle cell trait Father    Family Psychiatric  History: None reported Social History:  Social History   Substance and Sexual Activity  Alcohol Use Yes     Social History   Substance and Sexual Activity  Drug Use Yes  . Types: Marijuana    Social History   Socioeconomic History  . Marital status: Single    Spouse name: Not on file  . Number of children: Not on file  . Years of education: Not on file  . Highest education level: Not on file  Occupational History  . Not on file  Tobacco Use  . Smoking status: Current Every Day Smoker    Packs/day: 1.00  . Smokeless tobacco: Never Used  Substance and Sexual Activity  .  Alcohol use: Yes  . Drug use: Yes    Types: Marijuana  . Sexual activity: Not on file  Other Topics Concern  . Not on file  Social History Narrative  . Not on file   Social Determinants of Health   Financial Resource Strain:   . Difficulty of Paying Living Expenses: Not on file  Food Insecurity:   . Worried About Programme researcher, broadcasting/film/video in the Last Year: Not on file  . Ran Out  of Food in the Last Year: Not on file  Transportation Needs:   . Lack of Transportation (Medical): Not on file  . Lack of Transportation (Non-Medical): Not on file  Physical Activity:   . Days of Exercise per Week: Not on file  . Minutes of Exercise per Session: Not on file  Stress:   . Feeling of Stress : Not on file  Social Connections:   . Frequency of Communication with Friends and Family: Not on file  . Frequency of Social Gatherings with Friends and Family: Not on file  . Attends Religious Services: Not on file  . Active Member of Clubs or Organizations: Not on file  . Attends Banker Meetings: Not on file  . Marital Status: Not on file    Has this patient used any form of tobacco in the last 30 days? (Cigarettes, Smokeless Tobacco, Cigars, and/or Pipes) A prescription for an FDA-approved tobacco cessation medication was offered at discharge and the patient refused  Current Medications: Current Facility-Administered Medications  Medication Dose Route Frequency Provider Last Rate Last Admin  . LORazepam (ATIVAN) injection 0-4 mg  0-4 mg Intravenous Q6H Morelli, Brandon A, PA-C       Or  . LORazepam (ATIVAN) tablet 0-4 mg  0-4 mg Oral Q6H Morelli, Brandon A, PA-C      . [START ON 04/20/2020] LORazepam (ATIVAN) injection 0-4 mg  0-4 mg Intravenous Q12H Morelli, Brandon A, PA-C       Or  . Melene Muller ON 04/20/2020] LORazepam (ATIVAN) tablet 0-4 mg  0-4 mg Oral Q12H Morelli, Brandon A, PA-C      . pantoprazole (PROTONIX) EC tablet 40 mg  40 mg Oral Daily [name], PA-C   40 mg at 04/18/20 1029  . thiamine tablet 100 mg  100 mg Oral Daily [name], PA-C   100 mg at 04/18/20 1029   Current Outpatient Medications  Medication Sig Dispense Refill  . ondansetron (ZOFRAN ODT) 4 MG disintegrating tablet Take 1 tablet (4 mg total) by mouth every 8 (eight) hours as needed for nausea or vomiting. 20 tablet 0   PTA Medications: (Not in a hospital  admission)   Musculoskeletal: Strength & Muscle Tone: within normal limits Gait & Station: normal Patient leans: N/A  Psychiatric Specialty Exam: Physical Exam Vitals and nursing note reviewed.  Constitutional:      Appearance: She is well-developed.  HENT:     Head: Normocephalic.  Cardiovascular:     Rate and Rhythm: Normal rate.  Pulmonary:     Effort: Pulmonary effort is normal.  Neurological:     Mental Status: She is alert and oriented to person, place, and time.  Psychiatric:        Attention and Perception: Attention and perception normal.        Mood and Affect: Mood and affect normal.        Speech: Speech normal.        Behavior: Behavior normal. Behavior is cooperative.  Thought Content: Thought content normal.        Cognition and Memory: Cognition and memory normal.        Judgment: Judgment normal.     Review of Systems  Constitutional: Negative.   HENT: Negative.   Eyes: Negative.   Respiratory: Negative.   Cardiovascular: Negative.   Gastrointestinal: Negative.   Genitourinary: Negative.   Musculoskeletal: Negative.   Skin: Negative.   Neurological: Negative.   Psychiatric/Behavioral: Negative.     Blood pressure 125/83, pulse (!) 109, temperature 98.1 F (36.7 C), temperature source Oral, resp. rate 16, height 5\' 4"  (1.626 m), weight 68 kg, SpO2 98 %.Body mass index is 25.75 kg/m.  General Appearance: Casual and Fairly Groomed  Eye Contact:  Good  Speech:  Clear and Coherent and Normal Rate  Volume:  Normal  Mood:  Depressed  Affect:  Appropriate and Congruent  Thought Process:  Coherent, Goal Directed and Descriptions of Associations: Intact  Orientation:  Full (Time, Place, and Person)  Thought Content:  Logical  Suicidal Thoughts:  No  Homicidal Thoughts:  No  Memory:  Immediate;   Good Recent;   Good Remote;   Good  Judgement:  Fair  Insight:  Good  Psychomotor Activity:  Normal  Concentration:  Concentration: Good and  Attention Span: Good  Recall:  Good  Fund of Knowledge:  Good  Language:  Good  Akathisia:  No  Handed:  Right  AIMS (if indicated):     Assets:  Communication Skills Desire for Improvement Financial Resources/Insurance Intimacy Physical Health Resilience Social Support  ADL's:  Intact  Cognition:  WNL  Sleep:        Demographic Factors:  Age 11 or older  Loss Factors: Legal issues  Historical Factors: NA  Risk Reduction Factors:   Sense of responsibility to family, Positive social support, Positive therapeutic relationship and Positive coping skills or problem solving skills  Continued Clinical Symptoms:  Alcohol/Substance Abuse/Dependencies  Cognitive Features That Contribute To Risk:  None    Suicide Risk:  Minimal: No identifiable suicidal ideation.  Patients presenting with no risk factors but with morbid ruminations; may be classified as minimal risk based on the severity of the depressive symptoms    Plan Of Care/Follow-up recommendations:  Other:  Patient reviewed with Dr. 76.  Follow-up with outpatient psychiatry and substance use treatment resources provided. Social work and peers support consult ordered.   Disposition: Patient cleared by psychiatry. Nelly Rout, FNP 04/18/2020, 11:37 AM

## 2020-04-18 NOTE — ED Notes (Signed)
Pt discharged home. Discharged instructions read to pt who verbalized understanding. All belongings returned to pt. Denies SI/HI, is not delusional and not responding to internal stimuli. Escorted pt to the ED exit.   

## 2020-04-18 NOTE — ED Notes (Signed)
Pt states that he has Sickle Cell. When told that he has the trait he said, "Yea, but it can make you pass out.  I am homeless mam and it is cold out there."  Asked if he stays in shelters and he did not answer.

## 2020-04-18 NOTE — ED Provider Notes (Signed)
[facility]-EMERGENCY DEPT Provider Note   CSN: [id number] Arrival date & time: 04/18/20  0125     History Chief Complaint  Patient presents with   Sickle Cell Pain Crisis    LEVEL 5 CAVEAT 2/2 AMS/INTOXICATION  Guy [name] is a 24 y.o. adult.  24 year old male with history of ADHD, bipolar 1 disorder, depression presents to the emergency department this evening intoxicated.  It is unclear of how much the patient consumed prior to arrival or of any associated coingestions.  Triage note references patient complaining of sickle cell pain crisis, but he has no history of sickle cell anemia.  Apparently has been diagnosed with sickle cell trait.  Nurse tech reporting explanations of suicidal thoughts and a desire to die associated with the passing of his brother in August.  He has been seen multiple times in the emergency department for alcohol intoxication.  Hx of homelessness.   Sickle Cell Pain Crisis      Past Medical History:  Diagnosis Date   ADHD (attention deficit hyperactivity disorder)    Bipolar 1 disorder (HCC)    Depression    Migraine    PTSD (post-traumatic stress disorder)    Sickle cell trait (HCC)     Patient Active Problem List   Diagnosis Date Noted   Chest wall pain 05/31/2016   Fever 05/28/2016   Adjustment disorder with depressed mood 04/25/2016   Attention deficit hyperactivity disorder (ADHD) 04/25/2016    Past Surgical History:  Procedure Laterality Date   KNEE SURGERY     WISDOM TOOTH EXTRACTION         Family History  Problem Relation Age of Onset   Sickle cell trait Father     Social History   Tobacco Use   Smoking status: Current Every Day Smoker    Packs/day: 1.00   Smokeless tobacco: Never Used  Substance Use Topics   Alcohol use: Yes   Drug use: Yes    Types: Marijuana    Home Medications Prior to Admission medications   Medication Sig Start Date End Date Taking? Authorizing  Provider  ondansetron (ZOFRAN ODT) 4 MG disintegrating tablet Take 1 tablet (4 mg total) by mouth every 8 (eight) hours as needed for nausea or vomiting. 10/16/19   Terrilee Files, MD    Allergies    Shellfish allergy  Review of Systems   Review of Systems  Unable to perform ROS: Mental status change  Patient intoxicated   Physical Exam Updated Vital Signs BP 108/70    Pulse 81    Temp 98.1 F (36.7 C)    Resp 15    Ht 5\' 4"  (1.626 m)    Wt 68 kg    SpO2 96%    BMI 25.75 kg/m   Physical Exam Vitals and nursing note reviewed.  Constitutional:      General: She is not in acute distress.    Appearance: She is well-developed. She is not diaphoretic.     Comments: Sleeping on bed. Will fall back to sleep when not stimulated. Foul smelling.  HENT:     Head: Normocephalic and atraumatic.  Eyes:     General: No scleral icterus.    Conjunctiva/sclera: Conjunctivae normal.  Cardiovascular:     Rate and Rhythm: Normal rate and regular rhythm.     Pulses: Normal pulses.  Pulmonary:     Effort: Pulmonary effort is normal. No respiratory distress.     Comments: Respirations even and unlabored Musculoskeletal:  General: Normal range of motion.     Cervical back: Normal range of motion.  Skin:    General: Skin is warm and dry.     Coloration: Skin is not pale.     Findings: No erythema or rash.  Neurological:     Comments: Moving all extremities spontaneously.     ED Results / Procedures / Treatments   Labs (all labs ordered are listed, but only abnormal results are displayed) Labs Reviewed  COMPREHENSIVE METABOLIC PANEL - Abnormal; Notable for the following components:      Result Value   Glucose, Bld 107 (*)    Calcium 8.4 (*)    All other components within normal limits  RESPIRATORY PANEL BY RT PCR (FLU A&B, COVID)  CBC WITH DIFFERENTIAL/PLATELET    EKG None  Radiology No results found.  Procedures Procedures (including critical care time)  Medications  Ordered in ED Medications  ondansetron (ZOFRAN-ODT) disintegrating tablet 4 mg (has no administration in time range)  alum & mag hydroxide-simeth (MAALOX/MYLANTA) 200-200-20 MG/5ML suspension 30 mL (has no administration in time range)    And  lidocaine (XYLOCAINE) 2 % viscous mouth solution 15 mL (has no administration in time range)  pantoprazole (PROTONIX) EC tablet 40 mg (has no administration in time range)    ED Course  I have reviewed the triage vital signs and the nursing notes.  Pertinent labs & imaging results that were available during my care of the patient were reviewed by me and considered in my medical decision making (see chart for details).    MDM Rules/Calculators/A&P                          6:24 AM Patient presently awake and alert.  He is sleepy, but speech is clear.  He states that he came to the emergency department because his stomach hurt.  He does acknowledge that this is likely due to his history of alcohol abuse.  Will give GI cocktail as well as Protonix and Zofran.  Laboratory evaluation has been reassuring.  He is hemodynamically stable.  Denies history of seizures from alcohol withdrawal.  When inquiring about suicidal ideations, the patient states that he has had suicidal thoughts on and off.  Denies being suicidal currently, but would like to speak with behavioral health team member.  Has had a hard time coping with the passing of his brother.  Feel that he is appropriate for transfer to [facility] for evaluation pending COVID swab.  Care signed out to [place], [place] at shift change pending COVID results.    Final Clinical Impression(s) / ED Diagnoses Final diagnoses:  Alcoholic intoxication without complication (HCC)  Adjustment disorder with depressed mood    Rx / DC Orders ED Discharge Orders    None       [name], PA-C 04/18/20 2831    Rolan Bucco, MD 04/18/20 [phone]

## 2020-04-29 ENCOUNTER — Encounter: Payer: Self-pay | Admitting: *Deleted

## 2020-04-29 NOTE — Congregational Nurse Program (Signed)
  Dept: [facility] Nurse Program Note  Date of Encounter: 04/29/2020  Past Medical History: Past Medical History:  Diagnosis Date  . ADHD (attention deficit hyperactivity disorder)   . Bipolar 1 disorder ([place])   . Depression   . Migraine   . PTSD (post-traumatic stress disorder)   . Sickle cell trait ([place])     Encounter Details:  CNP Questionnaire - 04/29/20 1108      Questionnaire   Do you give verbal consent to treat you today? Yes    Visit Setting Church or Organization    Location Patient Served At [facility]    Patient Status Homeless    Insurance Uninsured (Includes [employer] Card/Care [place])    Printmaker;Refer;Support    Housing/Utilities No permanent housing    Transportation Need transportation assistance    Interpersonal Safety Do not feel physically and emotionally safe where you currently live    Referrals Behavioral/Mental Health Provider;PCP - other provider          Met Client at [facility] following recent ED visits and checked vitals. Client is currently out of medication for HTN. Referred client to Marliss Coots NP and he says he will see her today. Spoke with client about mental health needs as he recently lost his brother and takes medication for mental illness. He says he would like to talk with a therapist. Offered to call [facility] for appt. He would rather go to [facility]. Discussed application process and he plans to go in person to pick up application and make appt. Pt denies is and hi. Pt is currently working with [employer] team for housing.  Mahir Prabhakar W RN CN [phone]

## 2020-06-19 ENCOUNTER — Emergency Department (HOSPITAL_COMMUNITY)
Admission: EM | Admit: 2020-06-19 | Discharge: 2020-06-19 | Disposition: A | Discharge status: Home / Self Care | Payer: Self-pay | Attending: Emergency Medicine | Admitting: Emergency Medicine

## 2020-06-19 ENCOUNTER — Other Ambulatory Visit: Payer: Self-pay

## 2020-06-19 ENCOUNTER — Emergency Department (HOSPITAL_COMMUNITY): Payer: Self-pay

## 2020-06-19 DIAGNOSIS — M546 Pain in thoracic spine: Secondary | ICD-10-CM | POA: Insufficient documentation

## 2020-06-19 DIAGNOSIS — R079 Chest pain, unspecified: Secondary | ICD-10-CM | POA: Insufficient documentation

## 2020-06-19 DIAGNOSIS — F17200 Nicotine dependence, unspecified, uncomplicated: Secondary | ICD-10-CM | POA: Insufficient documentation

## 2020-06-19 DIAGNOSIS — R52 Pain, unspecified: Secondary | ICD-10-CM

## 2020-06-19 DIAGNOSIS — K130 Diseases of lips: Secondary | ICD-10-CM | POA: Insufficient documentation

## 2020-06-19 DIAGNOSIS — M545 Low back pain, unspecified: Secondary | ICD-10-CM | POA: Insufficient documentation

## 2020-06-19 MED ORDER — IBUPROFEN 800 MG PO TABS
800.0000 mg | ORAL_TABLET | Freq: Once | ORAL | Status: AC
  Administered 2020-06-19: 06:00:00 800 mg via ORAL
  Filled 2020-06-19: qty 1

## 2020-06-19 NOTE — ED Notes (Signed)
Patient transported to X-ray 

## 2020-06-19 NOTE — Discharge Instructions (Addendum)
You may alternate Tylenol 1000 mg every 6 hours as needed for pain, fever and Ibuprofen 800 mg every 8 hours as needed for pain, fever.  Please take Ibuprofen with food.  Do not take more than 4000 mg of Tylenol (acetaminophen) in a 24 hour period.   Your CT scans and x-ray showed no acute injury today.
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
## 2020-06-19 NOTE — ED Notes (Signed)
Pt reports having blurry vision and reports headache. Denies sob,nausea,vomitting.

## 2020-07-31 ENCOUNTER — Other Ambulatory Visit: Payer: Self-pay

## 2020-07-31 ENCOUNTER — Emergency Department (HOSPITAL_COMMUNITY)
Admission: EM | Admit: 2020-07-31 | Discharge: 2020-08-01 | Disposition: A | Discharge status: Home / Self Care | Payer: Medicaid Other | Attending: Emergency Medicine | Admitting: Emergency Medicine

## 2020-07-31 DIAGNOSIS — S20412A Abrasion of left back wall of thorax, initial encounter: Secondary | ICD-10-CM | POA: Insufficient documentation

## 2020-07-31 DIAGNOSIS — Z5321 Procedure and treatment not carried out due to patient leaving prior to being seen by health care provider: Secondary | ICD-10-CM | POA: Insufficient documentation
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
## 2020-07-31 NOTE — ED Notes (Addendum)
Pt informed staff that she is not waiting anymore.

## 2020-08-14 ENCOUNTER — Other Ambulatory Visit: Payer: Self-pay

## 2020-08-14 ENCOUNTER — Emergency Department (HOSPITAL_COMMUNITY)
Admission: EM | Admit: 2020-08-14 | Discharge: 2020-08-16 | Disposition: A | Discharge status: Other Acute Inpatient Hospital | Payer: Medicaid Other | Attending: Emergency Medicine | Admitting: Emergency Medicine

## 2020-08-14 DIAGNOSIS — Y908 Blood alcohol level of 240 mg/100 ml or more: Secondary | ICD-10-CM | POA: Insufficient documentation

## 2020-08-14 DIAGNOSIS — F10129 Alcohol abuse with intoxication, unspecified: Secondary | ICD-10-CM | POA: Insufficient documentation

## 2020-08-14 DIAGNOSIS — F17200 Nicotine dependence, unspecified, uncomplicated: Secondary | ICD-10-CM | POA: Insufficient documentation

## 2020-08-14 DIAGNOSIS — F10929 Alcohol use, unspecified with intoxication, unspecified: Secondary | ICD-10-CM

## 2020-08-14 DIAGNOSIS — Z20822 Contact with and (suspected) exposure to covid-19: Secondary | ICD-10-CM | POA: Insufficient documentation

## 2020-08-14 DIAGNOSIS — R45851 Suicidal ideations: Secondary | ICD-10-CM | POA: Insufficient documentation

## 2020-08-14 DIAGNOSIS — F332 Major depressive disorder, recurrent severe without psychotic features: Secondary | ICD-10-CM | POA: Insufficient documentation

## 2020-08-14 LAB — RAPID URINE DRUG SCREEN, HOSP PERFORMED
Amphetamines: NOT DETECTED
Barbiturates: NOT DETECTED
Benzodiazepines: NOT DETECTED
Cocaine: NOT DETECTED
Opiates: NOT DETECTED
Tetrahydrocannabinol: NOT DETECTED

## 2020-08-14 LAB — CBC
HCT: 49.7 % (ref 39.0–52.0)
Hemoglobin: 16.3 g/dL (ref 13.0–17.0)
MCH: 28.1 pg (ref 26.0–34.0)
MCHC: 32.8 g/dL (ref 30.0–36.0)
MCV: 85.5 fL (ref 80.0–100.0)
Platelets: 312 10*3/uL (ref 150–400)
RBC: 5.81 MIL/uL (ref 4.22–5.81)
RDW: 12.8 % (ref 11.5–15.5)
WBC: 4.9 10*3/uL (ref 4.0–10.5)
nRBC: 0 % (ref 0.0–0.2)

## 2020-08-14 LAB — COMPREHENSIVE METABOLIC PANEL
ALT: 25 U/L (ref 0–44)
AST: 34 U/L (ref 15–41)
Albumin: 4.8 g/dL (ref 3.5–5.0)
Alkaline Phosphatase: 73 U/L (ref 38–126)
Anion gap: 13 (ref 5–15)
BUN: 8 mg/dL (ref 6–20)
CO2: 26 mmol/L (ref 22–32)
Calcium: 9.1 mg/dL (ref 8.9–10.3)
Chloride: 105 mmol/L (ref 98–111)
Creatinine, Ser: 1.24 mg/dL (ref 0.61–1.24)
GFR, Estimated: >60 mL/min (ref >60)
Glucose, Bld: 100 mg/dL — ABNORMAL HIGH (ref 70–99)
Potassium: 4.5 mmol/L (ref 3.5–5.1)
Sodium: 144 mmol/L (ref 135–145)
Total Bilirubin: 1.1 mg/dL (ref 0.3–1.2)
Total Protein: 7.5 g/dL (ref 6.5–8.1)

## 2020-08-14 LAB — ACETAMINOPHEN LEVEL: Acetaminophen (Tylenol), Serum: <10 ug/mL — ABNORMAL LOW (ref 10–30)

## 2020-08-14 LAB — ETHANOL: Alcohol, Ethyl (B): 357 mg/dL (ref <10)

## 2020-08-14 LAB — SALICYLATE LEVEL: Salicylate Lvl: <7 mg/dL — ABNORMAL LOW (ref 7.0–30.0)
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
## 2020-08-15 LAB — RESP PANEL BY RT-PCR (FLU A&B, COVID) ARPGX2
Influenza A by PCR: NEGATIVE
Influenza B by PCR: NEGATIVE
SARS Coronavirus 2 by RT PCR: NEGATIVE

## 2020-08-15 MED ORDER — GABAPENTIN 100 MG PO CAPS
200.0000 mg | ORAL_CAPSULE | Freq: Two times a day (BID) | ORAL | Status: DC
  Administered 2020-08-15 – 2020-08-16 (×3): 200 mg via ORAL
  Filled 2020-08-15 (×3): qty 2

## 2020-08-15 NOTE — Care Management (Signed)
Writer referred patient to [facility]

## 2020-08-15 NOTE — ED Notes (Signed)
The pt reports  That he is depressed his brother died recently and he has been having a hard time  He has been in a mental facility x 3 and his last was one month ago

## 2020-08-15 NOTE — ED Notes (Signed)
Breakfast Ordered 

## 2020-08-15 NOTE — ED Notes (Signed)
To be ttsd shortly

## 2020-08-15 NOTE — ED Notes (Signed)
Patient is resting comfortably. 

## 2020-08-15 NOTE — BH Assessment (Signed)
Comprehensive Clinical Assessment (CCA) Note  08/15/2020 Guy [name] [id number] -Clinician reviewed note by [name], PA.  Pt is a 25 year old male with a history of ADH, bipolar disorder, PTSD presents to the emergency department for evaluation of depression.  Reports feeling depressed over the last few days due to situational stress.  Had a knife on him which was confiscated by police after he tried to cut himself.  Sustained no physical injuries.  Has been drinking alcohol today, but does not report any illicit drug use. No HI/AVH. Pt prefers "he, his" pronouns. Patient says that he has been having thoughts of killing himself with a knife.  Patient had a friend bring him to [facility].  Pt has has mutiple attempts in the past.  Pt is currently endorsing SI.  Patient denies any HI or current A/V hallucinations.  Patient admits to drinking two 40's per day.  Denies other drug use.  Patient BAL was 357 at 21:29.  Pt affect is congruent for depression.  Patient has good eye contact and is oriented x4.  He is not responding to internal stimuli.  Patient does not evidence any delusional thought process.  He reports poor sleep primarily due to homelessness.  Pt reports same for appetite.    Pt has no current outpatient provider.  Last inpatient was at [facility] about a year ago.    -Clinician discussed patient care with Nira Conn, FNP who recommends inpatient psychiatric care.  Clinician also informed her that patient meets inpatient care criteria.  AC Randa Evens said that there were no appropriate beds available at [facility] at this time.  At 05:50 clinician informed [name], PA that patient was a high risk per his C-SSRS score and 1:1 staffing was recommended.  She said she would put in for that staffing.  Flowsheet Row ED from 08/14/2020 in [facility] EMERGENCY DEPARTMENT  C-SSRS RISK CATEGORY High Risk      Chief Complaint:  Chief Complaint  Patient presents with  .  Psychiatric Evaluation   Visit Diagnosis: MDD recurrent, severe; ETOH use d/o severe   CCA Screening, Triage and Referral (STR)  Patient Reported Information How did you hear about [place]? Family/Friend  Referral name: No data recorded Referral phone number: No data recorded  Whom do you see for routine medical problems? Primary Care  Practice/Facility Name: Pt sees a doctor named "Guy [name]" at the [facility].  Practice/Facility Phone Number: No data recorded Name of Contact: No data recorded Contact Number: No data recorded Contact Fax Number: No data recorded Prescriber Name: No data recorded Prescriber Address (if known): No data recorded  What Is the Reason for Your Visit/Call Today? Pt says his brother died last year.  Brother was 20 years of age and this month is the anniversary of his birthday.  Patient is homeless in [place] and ahs been homelesss since 2017.  How Long Has This Been Causing You Problems? > than 6 months  What Do You Feel Would Help You the Most Today? Assessment Only   Have You Recently Been in Any Inpatient Treatment (Hospital/Detox/Crisis Center/28-Day Program)? No (Was at [facility] in 2021.)  Name/Location of Program/Hospital:No data recorded How Long Were You There? No data recorded When Were You Discharged? No data recorded  Have You Ever Received Services From [facility] Before? Yes  Who Do You See at [facility]? ED visits   Have You Recently Had Any Thoughts About Hurting Yourself? Yes  Are You Planning to Commit Suicide/Harm  Yourself At This time? Yes   Have you Recently Had Thoughts About Hurting Someone Guy [name]? No  Explanation: No data recorded  Have You Used Any Alcohol or Drugs in the Past 24 Hours? Yes  How Long Ago Did You Use Drugs or Alcohol? 1800  What Did You Use and How Much? Drank about three 40's.  His BAL was 357 at 21:29.   Do You Currently Have a Therapist/Psychiatrist? No  Name of Therapist/Psychiatrist: No data  recorded  Have You Been Recently Discharged From Any Office Practice or Programs? No  Explanation of Discharge From Practice/Program: No data recorded    CCA Screening Triage Referral Assessment Type of Contact: Tele-Assessment  Is this Initial or Reassessment? Initial Assessment  Date Telepsych consult ordered in [facility]:  08/15/2020  Time Telepsych consult ordered in [facility]:  0356   Patient Reported Information Reviewed? Yes  Patient Left Without Being Seen? No data recorded Reason for Not Completing Assessment: pt unable to right now   Collateral Involvement: No data recorded  Does Patient Have a Court Appointed Legal Guardian? No data recorded Name and Contact of Legal Guardian: Self  If Minor and Not Living with Parent(s), Who has Custody? Self  Is CPS involved or ever been involved? Never  Is APS involved or ever been involved? Never   Patient Determined To Be At Risk for Harm To Self or Others Based on Review of Patient Reported Information or Presenting Complaint? Yes, for Self-Harm  Method: No data recorded Availability of Means: No data recorded Intent: No data recorded Notification Required: No data recorded Additional Information for Danger to Others Potential: No data recorded Additional Comments for Danger to Others Potential: No data recorded Are There Guns or Other Weapons in Your Home? No data recorded Types of Guns/Weapons: No data recorded Are These Weapons Safely Secured?                            No data recorded Who Could Verify You Are Able To Have These Secured: No data recorded Do You Have any Outstanding Charges, Pending Court Dates, Parole/Probation? No data recorded Contacted To Inform of Risk of Harm To Self or Others: Other: Comment (Friend brought him over.  He feels like he needs help.)   Location of Assessment: [facility] ED   Does Patient Present under Involuntary Commitment? No  IVC Papers Initial File Date: No data recorded  [place] of  Residence: [place] (Homeless)   Patient Currently Receiving the Following Services: Not Receiving Services   Determination of Need: Emergent (2 hours)   Options For Referral: Inpatient Hospitalization     CCA Biopsychosocial Intake/Chief Complaint:  Pt has depression and feelings of wanting to kill himself.  Patient with plan to cut himself.  He had a knife on him when he came to [facility].  Patient has had multiple sucide attempts.  Patient denies any HI or current A/V hallucinations.  Pt drinks two 40's per day. Denies using other substances.  Patient says he is depressed about his brother (aged 97) dying.  This month is deceased brother's birth month.  Pt has had previous inpatient hospitalizations.  Current Symptoms/Problems: SI with plan.  Multiple previous attempts.  No HI or current A/V hallucinations.  Drinking two 40's per day.   Patient Reported Schizophrenia/Schizoaffective Diagnosis in Past: -- (Per patient)   Strengths: No data recorded Preferences: No data recorded Abilities: No data recorded  Type of Services Patient Feels are  Needed: No data recorded  Initial Clinical Notes/Concerns: No data recorded  Mental Health Symptoms Depression:  Change in energy/activity; Hopelessness; Worthlessness; Sleep (too much or little); Increase/decrease in appetite   Duration of Depressive symptoms: Greater than two weeks   Mania:  None   Anxiety:   Sleep; Worrying; Tension   Psychosis:  None   Duration of Psychotic symptoms: No data recorded  Trauma:  Avoids reminders of event   Obsessions:  None   Compulsions:  None   Inattention:  None   Hyperactivity/Impulsivity:  No data recorded  Oppositional/Defiant Behaviors:  None   Emotional Irregularity:  No data recorded  Other Mood/Personality Symptoms:  No data recorded   Mental Status Exam Appearance and self-care  Stature:  No data recorded  Weight:  No data recorded  Clothing:  No data recorded  Grooming:  No  data recorded  Cosmetic use:  No data recorded  Posture/gait:  No data recorded  Motor activity:  Not Remarkable   Sensorium  Attention:  Normal   Concentration:  Normal   Orientation:  X5   Recall/memory:  Normal   Affect and Mood  Affect:  Congruent; Depressed   Mood:  Depressed   Relating  Eye contact:  Normal   Facial expression:  Depressed   Attitude toward examiner:  Cooperative   Thought and Language  Speech flow: Clear and Coherent   Thought content:  Appropriate to Mood and Circumstances   Preoccupation:  Suicide   Hallucinations:  None (Hx of A/V hallucinations.  None in a year.)   Organization:  No data recorded  Affiliated Computer ServicesExecutive Functions  Fund of Knowledge:  Average   Intelligence:  Average   Abstraction:  Normal   Judgement:  Poor   Reality Testing:  Realistic   Insight:  Fair   Decision Making:  Normal   Social Functioning  Social Maturity:  Isolates   Social Judgement:  Normal   Stress  Stressors:  Housing; Grief/losses   Coping Ability:  Deficient supports   Skill Deficits:  No data recorded  Supports:  Support needed     Religion:    Leisure/Recreation:    Exercise/Diet: Exercise/Diet Do You Have Any Trouble Sleeping?: Yes Explanation of Sleeping Difficulties: 4 hours a day.   CCA Employment/Education Employment/Work Situation: Employment / Work Psychologist, occupationalituation Employment situation: Unemployed Has patient ever been in the [employer] Lilly and Companymilitary?: No  Education: Education Last Grade Completed: 12 Did Garment/textile technologistYou Graduate From [employer] School?: Yes Did Theme park managerYou Attend College?: No   CCA Family/Childhood History Family and Relationship History: Family history Marital status: Single Does patient have children?: No  Childhood History:  Childhood History By whom was/is the patient raised?: Mother Does patient have siblings?: Yes Number of Siblings: 2 (One brother is deceased.) Did patient suffer any verbal/emotional/physical/sexual abuse as a  child?: Yes Did patient suffer from severe childhood neglect?: No Has patient ever been sexually abused/assaulted/raped as an adolescent or adult?: No Was the patient ever a victim of a crime or a disaster?: No Witnessed domestic violence?: No Has patient been affected by domestic violence as an adult?: No  Child/Adolescent Assessment:     CCA Substance Use Alcohol/Drug Use: Alcohol / Drug Use Pain Medications: None Prescriptions: None Over the Counter: None History of alcohol / drug use?: Yes Withdrawal Symptoms: Sweats,Diarrhea,Fever / Chills,Weakness,Seizures Onset of Seizures: ETOH related Date of most recent seizure: seizure was two years ago. Substance #1 Name of Substance 1: ETOH 1 - Age of First Use: 25 years of age  1 - Amount (size/oz): Two 40's a day 1 - Frequency: Daily use 1 - Duration: ongoing 1 - Last Use / Amount: 02/17 around 18:00  Drank 3 forties 1 - Method of Aquiring: pruchase 1- Route of Use: Oral                       ASAM's:  Six Dimensions of Multidimensional Assessment  Dimension 1:  Acute Intoxication and/or Withdrawal Potential:      Dimension 2:  Biomedical Conditions and Complications:      Dimension 3:  Emotional, Behavioral, or Cognitive Conditions and Complications:     Dimension 4:  Readiness to Change:     Dimension 5:  Relapse, Continued use, or Continued Problem Potential:     Dimension 6:  Recovery/Living Environment:     ASAM Severity Score:    ASAM Recommended Level of Treatment:     Substance use Disorder (SUD) Substance Use Disorder (SUD)  Checklist Symptoms of Substance Use: Continued use despite having a persistent/recurrent physical/psychological problem caused/exacerbated by use,Recurrent use that results in a failure to fulfill major role obligations (work, school, home),Persistent desire or unsuccessful efforts to cut down or control use,Social, occupational, recreational activities given up or reduced due to  use  Recommendations for Services/Supports/Treatments: Recommendations for Services/Supports/Treatments Recommendations For Services/Supports/Treatments: IOP (Intensive Outpatient Program)  DSM5 Diagnoses: Patient Active Problem List   Diagnosis Date Noted  . Chest wall pain 05/31/2016  . Fever 05/28/2016  . Adjustment disorder with depressed mood 04/25/2016  . Attention deficit hyperactivity disorder (ADHD) 04/25/2016    Patient Centered Plan: Patient is on the following Treatment Plan(s):  Depression and Substance Abuse   Referrals to Alternative Service(s): Referred to Alternative Service(s):   Place:   Date:   Time:    Referred to Alternative Service(s):   Place:   Date:   Time:    Referred to Alternative Service(s):   Place:   Date:   Time:    Referred to Alternative Service(s):   Place:   Date:   Time:     Wandra Mannan

## 2020-08-15 NOTE — ED Notes (Addendum)
Pt in hall resting sitter at bedside.

## 2020-08-15 NOTE — ED Notes (Signed)
Drinking soda by the cup

## 2020-08-15 NOTE — BH Assessment (Signed)
Clinician to cal TTS cart in 10 minutes.    Redmond Pulling, MS, [facility], [facility] Triage Specialist [phone]

## 2020-08-15 NOTE — Care Management (Signed)
Per Nyra Jabs, NP - patient meets inpatient criteria.  Per [facility], no appropriate beds at [facility] and the patient should be faxed to other facilities.   Writer sent patient out to the following facilities:     [facility] Details  Fax          [address], [place] [address]     Internal comment    [facility] Details  Fax        [address] [place], [place] [address]     Internal comment    [facility] Details  Fax        [phone]. [address] [place] [address]     Internal comment    [facility] Details  Fax        [address] [place] [address]     Internal comment    [facility] Details  Fax        [address] [place] [address]     Internal comment    [facility] Details  Fax        [address] [place] [address]     Internal comment

## 2020-08-15 NOTE — BH Assessment (Addendum)
Clinician Jen Mow, RN via secure chat in Epic if the pt is able to be assessed if they can be placed in a private room (pt in hallway) and lastly if the pt is under IVC.   Clinician awaiting response.    Redmond Pulling, MS, [facility], [facility] Triage Specialist [phone]

## 2020-08-15 NOTE — ED Provider Notes (Signed)
[facility] EMERGENCY DEPARTMENT Provider Note   CSN: [id number] Arrival date & time: 08/14/20  2109     History Chief Complaint  Patient presents with  . Psychiatric Evaluation    Guy [name] is a 25 y.o. adult.  25 year old male with a history of ADH, bipolar disorder, PTSD presents to the emergency department for evaluation of depression.  Reports feeling depressed over the last few days due to situational stress.  Had a knife on him which was confiscated by police after he tried to cut himself.  Sustained no physical injuries.  Has been drinking alcohol today, but does not report any illicit drug use. No HI/AVH.  The history is provided by the patient. No language interpreter was used.       Past Medical History:  Diagnosis Date  . ADHD (attention deficit hyperactivity disorder)   . Bipolar 1 disorder (HCC)   . Depression   . Migraine   . PTSD (post-traumatic stress disorder)   . Sickle cell trait [facility])     Patient Active Problem List   Diagnosis Date Noted  . Chest wall pain 05/31/2016  . Fever 05/28/2016  . Adjustment disorder with depressed mood 04/25/2016  . Attention deficit hyperactivity disorder (ADHD) 04/25/2016    Past Surgical History:  Procedure Laterality Date  . KNEE SURGERY    . WISDOM TOOTH EXTRACTION         Family History  Problem Relation Age of Onset  . Sickle cell trait Father     Social History   Tobacco Use  . Smoking status: Current Every Day Smoker    Packs/day: 1.00  . Smokeless tobacco: Never Used  Substance Use Topics  . Alcohol use: Yes  . Drug use: Yes    Types: Marijuana    Home Medications Prior to Admission medications   Medication Sig Start Date End Date Taking? Authorizing Provider  divalproex (DEPAKOTE ER) 250 MG 24 hr tablet Take 250 mg by mouth daily.   Yes [provider]  methylphenidate 36 MG PO CR tablet Take 36 mg by mouth daily.   Yes [provider]   risperiDONE (RISPERDAL) 2 MG tablet Take 2 mg by mouth daily.   Yes [provider]    Allergies    Shellfish allergy  Review of Systems   Review of Systems  Ten systems reviewed and are negative for acute change, except as noted in the HPI.    Physical Exam Updated Vital Signs BP 112/60 (BP Location: Right Arm)   Pulse 81   Temp 98.9 F (37.2 C) (Oral)   Resp 18   Ht 5\' 6"  (1.676 m)   Wt 74.8 kg   SpO2 98%   BMI 26.63 kg/m   Physical Exam Vitals and nursing note reviewed.  Constitutional:      General: She is not in acute distress.    Appearance: She is well-developed and well-nourished. She is not diaphoretic.  HENT:     Head: Normocephalic and atraumatic.  Eyes:     General: No scleral icterus.    Extraocular Movements: EOM normal.     Conjunctiva/sclera: Conjunctivae normal.  Pulmonary:     Effort: Pulmonary effort is normal. No respiratory distress.  Musculoskeletal:        General: Normal range of motion.     Cervical back: Normal range of motion.  Skin:    General: Skin is warm and dry.     Coloration: Skin is not pale.  Findings: No erythema or rash.  Neurological:     Mental Status: She is alert and oriented to person, place, and time.  Psychiatric:        Attention and Perception: Attention normal.        Mood and Affect: Mood is depressed.        Speech: Speech normal.        Behavior: Behavior is cooperative.        Thought Content: Thought content does not include homicidal ideation.     ED Results / Procedures / Treatments   Labs (all labs ordered are listed, but only abnormal results are displayed) Labs Reviewed  COMPREHENSIVE METABOLIC PANEL - Abnormal; Notable for the following components:      Result Value   Glucose, Bld 100 (*)    All other components within normal limits  ETHANOL - Abnormal; Notable for the following components:   Alcohol, Ethyl (B) 357 (*)    All other components within normal limits  SALICYLATE  LEVEL - Abnormal; Notable for the following components:   Salicylate Lvl <7.0 (*)    All other components within normal limits  ACETAMINOPHEN LEVEL - Abnormal; Notable for the following components:   Acetaminophen (Tylenol), Serum <10 (*)    All other components within normal limits  RESP PANEL BY RT-PCR (FLU A&B, COVID) ARPGX2  CBC  RAPID URINE DRUG SCREEN, HOSP PERFORMED    EKG EKG Interpretation  Date/Time:  Friday August 14 2020 21:16:48 EST Ventricular Rate:  101 PR Interval:  140 QRS Duration: 70 QT Interval:  326 QTC Calculation: 422 R Axis:   89 Text Interpretation: Sinus tachycardia Otherwise normal ECG When compared with ECG of 04/18/2020, Left anterior fasicular block is no longer present Confirmed by Dione Booze ([id number]) on 08/15/2020 12:03:10 AM   Radiology No results found.  Procedures Procedures   Medications Ordered in ED Medications - No data to display  ED Course  I have reviewed the triage vital signs and the nursing notes.  Pertinent labs & imaging results that were available during my care of the patient were reviewed by me and considered in my medical decision making (see chart for details).    MDM Rules/Calculators/A&P                          25 year old male presents to the emergency department for worsening depression and suicidal ideations.  He has been medically cleared in the ED and assessed by TTS.  Recommendation is for inpatient psychiatric treatment.  Pending placement at this time.  Disposition to be determined by oncoming ED provider.   Final Clinical Impression(s) / ED Diagnoses Final diagnoses:  Suicidal ideation  Alcoholic intoxication with complication [facility])    Rx / DC Orders ED Discharge Orders    None       [name], PA-C 08/15/20 [id number]    Sabas Sous, MD 08/15/20 [phone]

## 2020-08-15 NOTE — ED Notes (Signed)
He reports that he has thought about cutting himself arms etc

## 2020-08-16 ENCOUNTER — Ambulatory Visit (HOSPITAL_COMMUNITY)
Admission: EM | Admit: 2020-08-16 | Discharge: 2020-08-17 | Disposition: A | Discharge status: Home / Self Care | Payer: No Payment, Other | Attending: Psychiatry | Admitting: Psychiatry

## 2020-08-16 ENCOUNTER — Encounter (HOSPITAL_COMMUNITY): Payer: Self-pay | Admitting: Registered Nurse

## 2020-08-16 ENCOUNTER — Other Ambulatory Visit: Payer: Self-pay

## 2020-08-16 DIAGNOSIS — F339 Major depressive disorder, recurrent, unspecified: Secondary | ICD-10-CM | POA: Diagnosis present

## 2020-08-16 DIAGNOSIS — Z79899 Other long term (current) drug therapy: Secondary | ICD-10-CM | POA: Insufficient documentation

## 2020-08-16 DIAGNOSIS — Z5900 Homelessness unspecified: Secondary | ICD-10-CM | POA: Insufficient documentation

## 2020-08-16 DIAGNOSIS — F17210 Nicotine dependence, cigarettes, uncomplicated: Secondary | ICD-10-CM | POA: Insufficient documentation

## 2020-08-16 DIAGNOSIS — R45851 Suicidal ideations: Secondary | ICD-10-CM | POA: Insufficient documentation

## 2020-08-16 DIAGNOSIS — R454 Irritability and anger: Secondary | ICD-10-CM | POA: Insufficient documentation

## 2020-08-16 DIAGNOSIS — F1994 Other psychoactive substance use, unspecified with psychoactive substance-induced mood disorder: Secondary | ICD-10-CM | POA: Insufficient documentation

## 2020-08-16 DIAGNOSIS — F32A Depression, unspecified: Secondary | ICD-10-CM | POA: Insufficient documentation

## 2020-08-16 LAB — LIPID PANEL
Cholesterol: 200 mg/dL (ref 0–200)
HDL: 92 mg/dL (ref >40)
LDL Cholesterol: 85 mg/dL (ref 0–99)
Total CHOL/HDL Ratio: 2.2 RATIO
Triglycerides: 116 mg/dL (ref <150)
VLDL: 23 mg/dL (ref 0–40)

## 2020-08-16 LAB — HEMOGLOBIN A1C
Hgb A1c MFr Bld: 6 % — ABNORMAL HIGH (ref 4.8–5.6)
Mean Plasma Glucose: 125.5 mg/dL

## 2020-08-16 MED ORDER — GABAPENTIN 300 MG PO CAPS
300.0000 mg | ORAL_CAPSULE | Freq: Two times a day (BID) | ORAL | Status: DC
  Administered 2020-08-16 – 2020-08-17 (×2): 300 mg via ORAL
  Filled 2020-08-16 (×2): qty 1

## 2020-08-16 MED ORDER — TRAZODONE HCL 50 MG PO TABS
50.0000 mg | ORAL_TABLET | Freq: Every evening | ORAL | Status: DC | PRN
  Administered 2020-08-16: 50 mg via ORAL
  Filled 2020-08-16: qty 1

## 2020-08-16 MED ORDER — MAGNESIUM HYDROXIDE 400 MG/5ML PO SUSP: 30.0000 mL | Freq: Every day | ORAL | Status: DC | PRN

## 2020-08-16 MED ORDER — HYDROXYZINE HCL 25 MG PO TABS
25.0000 mg | ORAL_TABLET | Freq: Three times a day (TID) | ORAL | Status: DC | PRN
  Administered 2020-08-16: 25 mg via ORAL
  Filled 2020-08-16: qty 1

## 2020-08-16 MED ORDER — ALUM & MAG HYDROXIDE-SIMETH 200-200-20 MG/5ML PO SUSP: 30.0000 mL | ORAL | Status: DC | PRN

## 2020-08-16 MED ORDER — RISPERIDONE 2 MG PO TABS
2.0000 mg | ORAL_TABLET | Freq: Every day | ORAL | Status: DC
  Administered 2020-08-17: 2 mg via ORAL
  Filled 2020-08-16: qty 1

## 2020-08-16 MED ORDER — METHYLPHENIDATE HCL ER (OSM) 18 MG PO TBCR: 36.0000 mg | EXTENDED_RELEASE_TABLET | Freq: Every day | ORAL | Status: DC

## 2020-08-16 MED ORDER — ACETAMINOPHEN 325 MG PO TABS: 650.0000 mg | ORAL_TABLET | Freq: Four times a day (QID) | ORAL | Status: DC | PRN

## 2020-08-16 MED ORDER — DIVALPROEX SODIUM ER 250 MG PO TB24
250.0000 mg | ORAL_TABLET | Freq: Every day | ORAL | Status: DC
  Administered 2020-08-17: 250 mg via ORAL
  Filled 2020-08-16: qty 1

## 2020-08-16 NOTE — Consult Note (Signed)
  Attempted to do psychiatric consult via tele health but machine is not working.  Patient presented to hospital intoxicated with complaints of suicidal ideation.  Recommendation:  Transfer to [facility].   I've spoken to Delrae Rend, NP and has accepted to [facility].

## 2020-08-16 NOTE — ED Notes (Signed)
Lunch Tray Ordered @ 1016. 

## 2020-08-16 NOTE — ED Notes (Signed)
Patient consumed all of meal tray. Remains calm and cooperative with care. Responding appropriately.

## 2020-08-16 NOTE — ED Notes (Signed)
EMTALA form printed and signed and by pt. Pt's belongings returned to pt in preparation for transfer to [facility]

## 2020-08-16 NOTE — ED Provider Notes (Cosign Needed Addendum)
Behavioral Health Admission H&P [facility] & OBS)  Date: 08/16/20 Patient Name: Guy [name] MRN: [id number] Chief Complaint: No chief complaint on file.     Diagnoses:  Final diagnoses:  None    HPI:  Guy [name] is a 25 year old male with a history of ADHD, bipolar disorder, PTSD, and depression who presented to the [facility] 08/14/20 for evaluation of depression and suicidal ideations due to situational stress. Patient was found to have knife in his possession which was confiscated by Patent examiner after he attempted to cut himself. Patient was noted to recently had ingested alcohol; BAC 357. Denies any other illicit substance use; UDS was negative. Patient continues to endorse suicidal ideations and increased depressive symptoms related to being homeless and losing a brother to suicide in the past year. Patient states his mother "left him" and moved to [place] in 2019; states he has no contact with mother and no local supports. Patient states that he does not have any current outpatient services.   Patient reports history of "bipolar-depression" that was diagnosed in 2004. Patient states he has previously taken "Risperidal, Depakote, and just about everything else".   On assessment patient is restricted and somewhat irritable; eye contact fleeting. Provider attempted to provide reassurance several times during assessment and assured patient that all services being offered were voluntary. Patient is currently reporting anhedonia, decreased sleep and appetite, depressive mood, increased mood swings, increased alcohol consumption, thoughts of hopelessness and worthlessness, intrusive thoughts of suicide, and feelings of guilt. Patient endorsing suicidal ideations with intent, plan unclear. He denies homicidal ideations, auditory or visual hallucinations, and does not appear to be responding to any external/internal ideations at this time. Patient was later apologetic expressing frustration with  his life and feelings of hopelessness.   PHQ 2-9:   Flowsheet Row ED from 08/14/2020 in [facility] EMERGENCY DEPARTMENT  C-SSRS RISK CATEGORY Error: Q2 is Yes, you must answer 3, 4, and 5      Total Time spent with patient: 15 minutes  Musculoskeletal  Strength & Muscle Tone: within normal limits Gait & Station: normal Patient leans: N/A  Psychiatric Specialty Exam  Presentation General Appearance: Appropriate for Environment  Eye Contact:Fleeting  Speech:Clear and Coherent  Speech Volume:Normal  Handedness:Right   Mood and Affect  Mood:Dysphoric; Irritable  Affect:Blunt; Restricted   Thought Process  Thought Processes:Linear  Descriptions of Associations:Intact  Orientation:Full (Time, Place and Person)  Thought Content:WDL  Hallucinations:Hallucinations: None  Ideas of Reference:None  Suicidal Thoughts:Suicidal Thoughts: Yes, Active SI Active Intent and/or Plan: With Intent; Without Plan  Homicidal Thoughts:Homicidal Thoughts: No  Sensorium  Memory:Immediate Fair; Recent Fair; Remote Fair  Judgment:Fair  Insight:Poor  Executive Functions  Concentration:Fair  Attention Span:Fair  Recall:Fair  Fund of Knowledge:Fair  Language:Fair  Psychomotor Activity  Psychomotor Activity:Psychomotor Activity: Normal  Assets  Assets:Resilience   Sleep  Sleep:Sleep: Fair   Physical Exam Psychiatric:        Attention and Perception: Attention and perception normal.        Mood and Affect: Affect is blunt and inappropriate.        Behavior: Behavior is agitated.        Thought Content: Thought content includes suicidal ideation. Thought content includes suicidal plan.        Cognition and Memory: Cognition and memory normal.        Judgment: Judgment is impulsive.    Review of Systems  Psychiatric/Behavioral: Positive for depression, substance abuse and suicidal ideas.  All other systems reviewed and are negative.   Blood  pressure (!) 144/89, pulse 64, temperature 98.7 F (37.1 C), temperature source Oral, resp. rate 18, SpO2 100 %. There is no height or weight on file to calculate BMI.  Past Psychiatric History:   -ADHD  -Bipolar 1 disorder  -Depression  -PTSD  Is the patient at risk to self? per pt report Has the patient been a risk to self in the past 6 months? Yes .    Has the patient been a risk to self within the distant past? Yes   Is the patient a risk to others? pt denies  Has the patient been a risk to others in the past 6 months? pt denies  Has the patient been a risk to others within the distant past? Yes   Past Medical History:  Past Medical History:  Diagnosis Date  . ADHD (attention deficit hyperactivity disorder)   . Bipolar 1 disorder (HCC)   . Depression   . Migraine   . PTSD (post-traumatic stress disorder)   . Sickle cell trait [facility])     Past Surgical History:  Procedure Laterality Date  . KNEE SURGERY    . WISDOM TOOTH EXTRACTION      Family History:  Family History  Problem Relation Age of Onset  . Sickle cell trait Father     Social History:  Social History   Socioeconomic History  . Marital status: Single    Spouse name: Not on file  . Number of children: Not on file  . Years of education: Not on file  . Highest education level: Not on file  Occupational History  . Not on file  Tobacco Use  . Smoking status: Current Every Day Smoker    Packs/day: 1.00  . Smokeless tobacco: Never Used  Substance and Sexual Activity  . Alcohol use: Yes  . Drug use: Yes    Types: Marijuana  . Sexual activity: Not on file  Other Topics Concern  . Not on file  Social History Narrative  . Not on file   Social Determinants of Health   Financial Resource Strain: Not on file  Food Insecurity: Not on file  Transportation Needs: Not on file  Physical Activity: Not on file  Stress: Not on file  Social Connections: Not on file  Intimate Partner Violence: Not on file     SDOH:  SDOH Screenings   Alcohol Screen: Not on file  Depression (IRS8-5): Not on file  Financial Resource Strain: Not on file  Food Insecurity: Not on file  Housing: Not on file  Physical Activity: Not on file  Social Connections: Not on file  Stress: Not on file  Tobacco Use: High Risk  . Smoking Tobacco Use: Current Every Day Smoker  . Smokeless Tobacco Use: Never Used  Transportation Needs: Not on file    Last Labs:  Admission on 08/14/2020  Component Date Value Ref Range Status  . Sodium 08/14/2020 144  135 - 145 mmol/L Final  . Potassium 08/14/2020 4.5  3.5 - 5.1 mmol/L Final  . Chloride 08/14/2020 105  98 - 111 mmol/L Final  . CO2 08/14/2020 26  22 - 32 mmol/L Final  . Glucose, Bld 08/14/2020 100* 70 - 99 mg/dL Final   Glucose reference range applies only to samples taken after fasting for at least 8 hours.  . BUN 08/14/2020 8  6 - 20 mg/dL Final  . Creatinine, Ser 08/14/2020 1.24  0.61 - 1.24 mg/dL Final  .  Calcium 08/14/2020 9.1  8.9 - 10.3 mg/dL Final  . Total Protein 08/14/2020 7.5  6.5 - 8.1 g/dL Final  . Albumin 40/98/119102/18/2022 4.8  3.5 - 5.0 g/dL Final  . AST 47/82/956202/18/2022 34  15 - 41 U/L Final  . ALT 08/14/2020 25  0 - 44 U/L Final  . Alkaline Phosphatase 08/14/2020 73  38 - 126 U/L Final  . Total Bilirubin 08/14/2020 1.1  0.3 - 1.2 mg/dL Final  . GFR, Estimated 08/14/2020 >60  >60 mL/min Final   Comment: (NOTE) Calculated using the CKD-EPI Creatinine Equation (2021)   . Anion gap 08/14/2020 13  5 - 15 Final   Performed at [facility], [address] [place], [place] [address]  . Alcohol, Ethyl (B) 08/14/2020 357* <10 mg/dL Final   Comment: CRITICAL RESULT CALLED TO, READ BACK BY AND VERIFIED WITH: K.MUNETT RN [id number] 08/14/20 MCCORMICK K (NOTE) Lowest detectable limit for serum alcohol is 10 mg/dL.  For medical purposes only. Performed at [facility], [address] [place], [place] [address]   . Salicylate Lvl 08/14/2020 <7.0* 7.0 - 30.0  mg/dL Final   Performed at [facility], [address] [place], [place] [address]  . Acetaminophen (Tylenol), Serum 08/14/2020 <10* 10 - 30 ug/mL Final   Comment: (NOTE) Therapeutic concentrations vary significantly. A range of 10-30 ug/mL  may be an effective concentration for many patients. However, some  are best treated at concentrations outside of this range. Acetaminophen concentrations >150 ug/mL at 4 hours after ingestion  and >50 ug/mL at 12 hours after ingestion are often associated with  toxic reactions.  Performed at [facility], [address] [place], [place] [address]   . WBC 08/14/2020 4.9  4.0 - 10.5 K/uL Final  . RBC 08/14/2020 5.81  4.22 - 5.81 MIL/uL Final  . Hemoglobin 08/14/2020 16.3  13.0 - 17.0 g/dL Final  . HCT 24/40/102702/18/2022 49.7  39.0 - 52.0 % Final  . MCV 08/14/2020 85.5  80.0 - 100.0 fL Final  . MCH 08/14/2020 28.1  26.0 - 34.0 pg Final  . MCHC 08/14/2020 32.8  30.0 - 36.0 g/dL Final  . RDW 25/36/644002/18/2022 12.8  11.5 - 15.5 % Final  . Platelets 08/14/2020 312  150 - 400 K/uL Final  . nRBC 08/14/2020 0.0  0.0 - 0.2 % Final   Performed at [facility], [address] [place], [place] [address]  . Opiates 08/14/2020 NONE DETECTED  NONE DETECTED Final  . Cocaine 08/14/2020 NONE DETECTED  NONE DETECTED Final  . Benzodiazepines 08/14/2020 NONE DETECTED  NONE DETECTED Final  . Amphetamines 08/14/2020 NONE DETECTED  NONE DETECTED Final  . Tetrahydrocannabinol 08/14/2020 NONE DETECTED  NONE DETECTED Final  . Barbiturates 08/14/2020 NONE DETECTED  NONE DETECTED Final   Comment: (NOTE) DRUG SCREEN FOR MEDICAL PURPOSES ONLY.  IF CONFIRMATION IS NEEDED FOR ANY PURPOSE, NOTIFY LAB WITHIN 5 DAYS.  LOWEST DETECTABLE LIMITS FOR URINE DRUG SCREEN Drug Class                     Cutoff (ng/mL) Amphetamine and metabolites    1000 Barbiturate and metabolites    200 Benzodiazepine                 200 Tricyclics and metabolites     300 Opiates and  metabolites        300 Cocaine and metabolites        300  THC                            50 Performed at [facility], [address], [place], [place] [address]   . SARS Coronavirus 2 by RT PCR 08/15/2020 NEGATIVE  NEGATIVE Final   Comment: (NOTE) SARS-CoV-2 target nucleic acids are NOT DETECTED.  The SARS-CoV-2 RNA is generally detectable in upper respiratory specimens during the acute phase of infection. The lowest concentration of SARS-CoV-2 viral copies this assay can detect is 138 copies/mL. A negative result does not preclude SARS-Cov-2 infection and should not be used as the sole basis for treatment or other patient management decisions. A negative result may occur with  improper specimen collection/handling, submission of specimen other than nasopharyngeal swab, presence of viral mutation(s) within the areas targeted by this assay, and inadequate number of viral copies(<138 copies/mL). A negative result must be combined with clinical observations, patient history, and epidemiological information. The expected result is Negative.  Fact Sheet for Patients:  BloggerCourse.com  Fact Sheet for Healthcare Providers:  SeriousBroker.it  This test is no                          t yet approved or cleared by the [place] FDA and  has been authorized for detection and/or diagnosis of SARS-CoV-2 by FDA under an Emergency Use Authorization (EUA). This EUA will remain  in effect (meaning this test can be used) for the duration of the COVID-19 declaration under Section 564(b)(1) of the Act, 21 U.S.C.section 360bbb-3(b)(1), unless the authorization is terminated  or revoked sooner.      . Influenza A by PCR 08/15/2020 NEGATIVE  NEGATIVE Final  . Influenza B by PCR 08/15/2020 NEGATIVE  NEGATIVE Final   Comment: (NOTE) The Xpert Xpress SARS-CoV-2/FLU/RSV plus assay is intended as an aid in the diagnosis of influenza from  Nasopharyngeal swab specimens and should not be used as a sole basis for treatment. Nasal washings and aspirates are unacceptable for Xpert Xpress SARS-CoV-2/FLU/RSV testing.  Fact Sheet for Patients: BloggerCourse.com  Fact Sheet for Healthcare Providers: SeriousBroker.it  This test is not yet approved or cleared by the [place] FDA and has been authorized for detection and/or diagnosis of SARS-CoV-2 by FDA under an Emergency Use Authorization (EUA). This EUA will remain in effect (meaning this test can be used) for the duration of the COVID-19 declaration under Section 564(b)(1) of the Act, 21 U.S.C. section 360bbb-3(b)(1), unless the authorization is terminated or revoked.  Performed at [facility], [address], [place], [place] [address]   Admission on 04/18/2020, Discharged on 04/18/2020  Component Date Value Ref Range Status  . Sodium 04/18/2020 138  135 - 145 mmol/L Final  . Potassium 04/18/2020 3.9  3.5 - 5.1 mmol/L Final  . Chloride 04/18/2020 100  98 - 111 mmol/L Final  . CO2 04/18/2020 23  22 - 32 mmol/L Final  . Glucose, Bld 04/18/2020 107* 70 - 99 mg/dL Final   Glucose reference range applies only to samples taken after fasting for at least 8 hours.  . BUN 04/18/2020 11  6 - 20 mg/dL Final  . Creatinine, Ser 04/18/2020 0.92  0.61 - 1.24 mg/dL Final  . Calcium 09/81/1914 8.4* 8.9 - 10.3 mg/dL Final  . Total Protein 04/18/2020 7.9  6.5 - 8.1 g/dL Final  . Albumin 78/29/5621 4.7  3.5 - 5.0 g/dL Final  .  AST 04/18/2020 30  15 - 41 U/L Final  . ALT 04/18/2020 19  0 - 44 U/L Final  . Alkaline Phosphatase 04/18/2020 72  38 - 126 U/L Final  . Total Bilirubin 04/18/2020 0.6  0.3 - 1.2 mg/dL Final  . GFR, Estimated 04/18/2020 >60  >60 mL/min Final   Comment: (NOTE) Calculated using the CKD-EPI Creatinine Equation (2021)   . Anion gap 04/18/2020 15  5 - 15 Final   Performed at [facility],  [address], [place], [place] [address]  . WBC 04/18/2020 5.5  4.0 - 10.5 K/uL Final  . RBC 04/18/2020 5.13  4.22 - 5.81 MIL/uL Final  . Hemoglobin 04/18/2020 15.0  13.0 - 17.0 g/dL Final  . HCT 69/67/8938 45.4  39.0 - 52.0 % Final  . MCV 04/18/2020 88.5  80.0 - 100.0 fL Final  . MCH 04/18/2020 29.2  26.0 - 34.0 pg Final  . MCHC 04/18/2020 33.0  30.0 - 36.0 g/dL Final  . RDW 04/12/5101 13.2  11.5 - 15.5 % Final  . Platelets 04/18/2020 202  150 - 400 K/uL Final  . nRBC 04/18/2020 0.0  0.0 - 0.2 % Final  . Neutrophils Relative % 04/18/2020 56  % Final  . Neutro Abs 04/18/2020 3.1  1.7 - 7.7 K/uL Final  . Lymphocytes Relative 04/18/2020 32  % Final  . Lymphs Abs 04/18/2020 1.8  0.7 - 4.0 K/uL Final  . Monocytes Relative 04/18/2020 9  % Final  . Monocytes Absolute 04/18/2020 0.5  0.1 - 1.0 K/uL Final  . Eosinophils Relative 04/18/2020 2  % Final  . Eosinophils Absolute 04/18/2020 0.1  0.0 - 0.5 K/uL Final  . Basophils Relative 04/18/2020 1  % Final  . Basophils Absolute 04/18/2020 0.0  0.0 - 0.1 K/uL Final  . Immature Granulocytes 04/18/2020 0  % Final  . Abs Immature Granulocytes 04/18/2020 0.02  0.00 - 0.07 K/uL Final   Performed at [facility], [address], [place], [place] [address]  . SARS Coronavirus 2 by RT PCR 04/18/2020 NEGATIVE  NEGATIVE Final   Comment: (NOTE) SARS-CoV-2 target nucleic acids are NOT DETECTED.  The SARS-CoV-2 RNA is generally detectable in upper respiratoy specimens during the acute phase of infection. The lowest concentration of SARS-CoV-2 viral copies this assay can detect is 131 copies/mL. A negative result does not preclude SARS-Cov-2 infection and should not be used as the sole basis for treatment or other patient management decisions. A negative result may occur with  improper specimen collection/handling, submission of specimen other than nasopharyngeal swab, presence of viral mutation(s) within the areas targeted by this  assay, and inadequate number of viral copies (<131 copies/mL). A negative result must be combined with clinical observations, patient history, and epidemiological information. The expected result is Negative.  Fact Sheet for Patients:  https://www.moore.com/  Fact Sheet for Healthcare Providers:  https://www.young.biz/  This test is no                          t yet approved or cleared by the [place] FDA and  has been authorized for detection and/or diagnosis of SARS-CoV-2 by FDA under an Emergency Use Authorization (EUA). This EUA will remain  in effect (meaning this test can be used) for the duration of the COVID-19 declaration under Section 564(b)(1) of the Act, 21 U.S.C. section 360bbb-3(b)(1), unless the authorization is terminated or revoked sooner.    . Influenza A by  PCR 04/18/2020 NEGATIVE  NEGATIVE Final  . Influenza B by PCR 04/18/2020 NEGATIVE  NEGATIVE Final   Comment: (NOTE) The Xpert Xpress SARS-CoV-2/FLU/RSV assay is intended as an aid in  the diagnosis of influenza from Nasopharyngeal swab specimens and  should not be used as a sole basis for treatment. Nasal washings and  aspirates are unacceptable for Xpert Xpress SARS-CoV-2/FLU/RSV  testing.  Fact Sheet for Patients: https://www.moore.com/  Fact Sheet for Healthcare Providers: https://www.young.biz/  This test is not yet approved or cleared by the [place] FDA and  has been authorized for detection and/or diagnosis of SARS-CoV-2 by  FDA under an Emergency Use Authorization (EUA). This EUA will remain  in effect (meaning this test can be used) for the duration of the  Covid-19 declaration under Section 564(b)(1) of the Act, 21  U.S.C. section 360bbb-3(b)(1), unless the authorization is  terminated or revoked. Performed at [facility], [address], [place], [place] [address]    Allergies:  Shellfish allergy  PTA Medications: (Not in a hospital admission)   Medical Decision Making   -Observe overnight in [facility]  -Restart medications for mood  -Re-evaluate by psychiatry in the morning.     Recommendations  Based on my evaluation the patient does not appear to have an emergency medical condition. Patient to remain overnight for further observation and stabilization. Re-evaluated by psychiatry in the morning.   Loletta Parish, NP 08/16/20  5:22 PM

## 2020-08-16 NOTE — ED Provider Notes (Signed)
Emergency Medicine Observation Re-evaluation Note  Guy [name] is a 25 y.o. adult, seen on rounds today.  Pt initially presented to the ED for complaints of Psychiatric Evaluation Currently, the patient is awaiting inpatient psychiatry placement.  Physical Exam  BP 132/88   Pulse 82   Temp 97.8 F (36.6 C) (Oral)   Resp 16   Ht 5\' 6"  (1.676 m)   Wt 74.8 kg   SpO2 96%   BMI 26.63 kg/m  Physical Exam General: Calm and cooperative Cardiac: Well perfused  Lungs: Even, unlabored respirations Psych: Calm   ED Course / MDM  EKG:EKG Interpretation  Date/Time:  Friday August 14 2020 21:16:48 EST Ventricular Rate:  101 PR Interval:  140 QRS Duration: 70 QT Interval:  326 QTC Calculation: 422 R Axis:   89 Text Interpretation: Sinus tachycardia Otherwise normal ECG When compared with ECG of 04/18/2020, Left anterior fasicular block is no longer present Confirmed by 04/20/2020 (Dione Booze) on 08/15/2020 12:03:10 AM Also confirmed by 08/17/2020 (Dione Booze), editor [id number] ([id number])  on 08/15/2020 2:43:21 PM    I have reviewed the labs performed to date as well as medications administered while in observation.  Recent changes in the last 24 hours include patient meets inpatient criteria for psych and is awaiting placement. Patient is voluntary at this time.   01:49 PM  Patient accepted to [facility], NP accepting. Will arrange transfer. EMTALA paperwork completed.   Plan   Patient is not under full IVC at this time.   Hillery Jacks, MD 08/16/20 1351

## 2020-08-16 NOTE — ED Notes (Addendum)
25 yo male transferred from [facility] voluntarily endorsing SI with unclear plans voiced by pt. Easily agitated. Pt reports, "My mom left me and moved back to [place] in 2019; in 2020, my boyfriend went to jail; in 2021 my brother committed suicide, now I'm homeless with no where to go. I minus well be dead". Pt unable to contract for safety. Oriented to unit and unit rules. Will monitor for safety.

## 2020-08-16 NOTE — ED Notes (Signed)
Pt A&O x 4, no distress noted, watching TV at present.  Remains SI,  Monitoring for safety.

## 2020-08-16 NOTE — ED Notes (Signed)
Pt belongings in locker #27  

## 2020-08-16 NOTE — ED Notes (Signed)
Breakfast ordered 

## 2020-08-17 ENCOUNTER — Other Ambulatory Visit: Payer: Self-pay

## 2020-08-17 ENCOUNTER — Inpatient Hospital Stay (HOSPITAL_COMMUNITY)
Admission: AD | Admit: 2020-08-17 | Discharge: 2020-08-22 | DRG: 885 | Disposition: A | Discharge status: Home / Self Care | Payer: Federal, State, Local not specified - Other | Source: Intra-hospital | Attending: Psychiatry | Admitting: Psychiatry

## 2020-08-17 ENCOUNTER — Encounter (HOSPITAL_COMMUNITY): Payer: Self-pay | Admitting: Psychiatry

## 2020-08-17 DIAGNOSIS — F1990 Other psychoactive substance use, unspecified, uncomplicated: Secondary | ICD-10-CM | POA: Diagnosis present

## 2020-08-17 DIAGNOSIS — F17210 Nicotine dependence, cigarettes, uncomplicated: Secondary | ICD-10-CM | POA: Diagnosis present

## 2020-08-17 DIAGNOSIS — Z832 Family history of diseases of the blood and blood-forming organs and certain disorders involving the immune mechanism: Secondary | ICD-10-CM | POA: Diagnosis not present

## 2020-08-17 DIAGNOSIS — F3130 Bipolar disorder, current episode depressed, mild or moderate severity, unspecified: Secondary | ICD-10-CM | POA: Diagnosis not present

## 2020-08-17 DIAGNOSIS — R45851 Suicidal ideations: Secondary | ICD-10-CM

## 2020-08-17 DIAGNOSIS — Z5900 Homelessness unspecified: Secondary | ICD-10-CM

## 2020-08-17 DIAGNOSIS — D573 Sickle-cell trait: Secondary | ICD-10-CM | POA: Diagnosis present

## 2020-08-17 DIAGNOSIS — F1994 Other psychoactive substance use, unspecified with psychoactive substance-induced mood disorder: Secondary | ICD-10-CM | POA: Diagnosis present

## 2020-08-17 LAB — TSH: TSH: 3.872 u[IU]/mL (ref 0.350–4.500)

## 2020-08-17 LAB — POC SARS CORONAVIRUS 2 AG: SARS Coronavirus 2 Ag: NEGATIVE

## 2020-08-17 LAB — VALPROIC ACID LEVEL: Valproic Acid Lvl: <10 ug/mL — ABNORMAL LOW (ref 50.0–100.0)

## 2020-08-17 MED ORDER — LOPERAMIDE HCL 2 MG PO CAPS: 2.0000 mg | ORAL_CAPSULE | ORAL | Status: AC | PRN

## 2020-08-17 MED ORDER — ACETAMINOPHEN 325 MG PO TABS
650.0000 mg | ORAL_TABLET | Freq: Four times a day (QID) | ORAL | Status: DC | PRN
  Administered 2020-08-17 – 2020-08-21 (×2): 650 mg via ORAL
  Filled 2020-08-17 (×2): qty 2

## 2020-08-17 MED ORDER — HYDROXYZINE HCL 25 MG PO TABS: 25.0000 mg | ORAL_TABLET | Freq: Four times a day (QID) | ORAL | Status: AC | PRN

## 2020-08-17 MED ORDER — THIAMINE HCL 100 MG PO TABS
100.0000 mg | ORAL_TABLET | Freq: Every day | ORAL | Status: DC
  Administered 2020-08-18 – 2020-08-22 (×5): 100 mg via ORAL
  Filled 2020-08-17 (×8): qty 1

## 2020-08-17 MED ORDER — THIAMINE HCL 100 MG/ML IJ SOLN
100.0000 mg | Freq: Once | INTRAMUSCULAR | Status: DC
  Filled 2020-08-17: qty 2

## 2020-08-17 MED ORDER — ALUM & MAG HYDROXIDE-SIMETH 200-200-20 MG/5ML PO SUSP: 30.0000 mL | ORAL | Status: DC | PRN

## 2020-08-17 MED ORDER — ADULT MULTIVITAMIN W/MINERALS CH
1.0000 | ORAL_TABLET | Freq: Every day | ORAL | Status: DC
  Administered 2020-08-17 – 2020-08-22 (×6): 1 via ORAL
  Filled 2020-08-17 (×10): qty 1

## 2020-08-17 MED ORDER — HYDROXYZINE HCL 25 MG PO TABS
25.0000 mg | ORAL_TABLET | Freq: Three times a day (TID) | ORAL | Status: DC | PRN
  Administered 2020-08-17 – 2020-08-21 (×4): 25 mg via ORAL
  Filled 2020-08-17 (×4): qty 1

## 2020-08-17 MED ORDER — NICOTINE POLACRILEX 2 MG MT GUM
2.0000 mg | CHEWING_GUM | OROMUCOSAL | Status: DC | PRN
  Administered 2020-08-17: 2 mg via ORAL

## 2020-08-17 MED ORDER — LORAZEPAM 1 MG PO TABS: 1.0000 mg | ORAL_TABLET | Freq: Four times a day (QID) | ORAL | Status: AC | PRN

## 2020-08-17 MED ORDER — DIVALPROEX SODIUM ER 250 MG PO TB24
250.0000 mg | ORAL_TABLET | Freq: Every day | ORAL | Status: DC
  Administered 2020-08-17 – 2020-08-18 (×2): 250 mg via ORAL
  Filled 2020-08-17 (×6): qty 1

## 2020-08-17 MED ORDER — TRAZODONE HCL 50 MG PO TABS
50.0000 mg | ORAL_TABLET | Freq: Every evening | ORAL | Status: DC | PRN
  Administered 2020-08-17 – 2020-08-21 (×4): 50 mg via ORAL
  Filled 2020-08-17 (×5): qty 1

## 2020-08-17 MED ORDER — RISPERIDONE 2 MG PO TABS
2.0000 mg | ORAL_TABLET | Freq: Every day | ORAL | Status: DC
  Administered 2020-08-17 – 2020-08-22 (×6): 2 mg via ORAL
  Filled 2020-08-17 (×4): qty 1
  Filled 2020-08-17: qty 7
  Filled 2020-08-17 (×5): qty 1

## 2020-08-17 MED ORDER — ONDANSETRON 4 MG PO TBDP: 4.0000 mg | ORAL_TABLET | Freq: Four times a day (QID) | ORAL | Status: AC | PRN

## 2020-08-17 MED ORDER — MAGNESIUM HYDROXIDE 400 MG/5ML PO SUSP: 30.0000 mL | Freq: Every day | ORAL | Status: DC | PRN

## 2020-08-17 NOTE — Progress Notes (Signed)
Pt accepted to [facility] 303-1  Meets inpatient criteria per Earlene Plater, MD .    Dr. Jola Babinski is the attending provider.    Call report to [phone]    Laretta Alstrom, RN @ [facility] notified via secure chat.     Pt is IVC.    Pt may be transported by [facility]   Pt scheduled  to arrive at [facility] at 1300 PM.  Signed:  Corky Crafts, MSW, [place], LCASA 08/17/2020 12:55 PM

## 2020-08-17 NOTE — Tx Team (Signed)
Initial Treatment Plan 08/17/2020 7:11 PM [name] PJA:[id number]    PATIENT STRESSORS: Financial difficulties Medication change or noncompliance Substance abuse   PATIENT STRENGTHS: Capable of independent living Manufacturing systems engineer Motivation for treatment/growth   PATIENT IDENTIFIED PROBLEMS: "suicidal thoughts"  "alcohol"  "anxiety"  "depression"               DISCHARGE CRITERIA:  Ability to meet basic life and health needs Adequate post-discharge living arrangements Improved stabilization in mood, thinking, and/or behavior Medical problems require only outpatient monitoring Motivation to continue treatment in a less acute level of care Need for constant or close observation no longer present Reduction of life-threatening or endangering symptoms to within safe limits Safe-care adequate arrangements made Withdrawal symptoms are absent or subacute and managed without 24-hour nursing intervention  PRELIMINARY DISCHARGE PLAN: Attend aftercare/continuing care group Attend PHP/IOP Attend 12-step recovery group Placement in alternative living arrangements  PATIENT/FAMILY INVOLVEMENT: This treatment plan has been presented to and reviewed with the patient, Guy [name].  The patient and family have been given the opportunity to ask questions and make suggestions.  Quintella Reichert [place], [place] 08/17/2020, 7:11 PM

## 2020-08-17 NOTE — Progress Notes (Signed)
The patient rated his day as a 5 out of 10 . He would only share that he managed to "survive" his day. His goal for tomorrow is to "stay strong" and keep his head up.

## 2020-08-17 NOTE — ED Notes (Signed)
Pt sleeping at present, no distress noted, monitoring for safety. 

## 2020-08-17 NOTE — Plan of Care (Signed)
Nurse discussed coping skills with patient and was fed.
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
## 2020-08-17 NOTE — Progress Notes (Signed)
Patient information has been sent to [facility] via secure chat to review for potential admission. Patient meets inpatient criteria per Earlene Plater, MD.   Situation ongoing, CSW will continue to monitor progress.    Signed:  Corky Crafts, MSW, [place], LCASA 08/17/2020 10:42 AM

## 2020-08-17 NOTE — Progress Notes (Signed)
Pt is minimal, irritable, and guarded upon assessment tonight. He does agree that one of his main stressors is that he is currently homeless and that he has had no contact with his mother. Otherwise pt said that he didn't want to discuss it further. Pt was present in the dayroom, interacted with his peers pleasantly, and attended group earlier tonight. He did c/o body aches and chills for which he was administered his PRN tylenol. Pt denies SI/HI and AVH. Active listening, reassurance, and support provided. Medications administered as ordered by provider. Q 15 min safety checks continue. Pt's safety has been maintained.   08/17/20 2115  Psych Admission Type (Psych Patients Only)  Admission Status Voluntary  Psychosocial Assessment  Patient Complaints Depression;Sadness;Worrying;Irritability  Eye Contact Brief  Facial Expression Pensive;Sullen;Sad;Worried  Affect Anxious;Depressed;Irritable;Sad;Sullen  Speech Logical/coherent;Soft;Slow  Interaction Guarded;Minimal;Forwards little  Motor Activity Fidgety  Appearance/Hygiene Unremarkable  Behavior Characteristics Cooperative;Anxious;Fidgety;Guarded  Mood Depressed;Anxious;Irritable;Sad;Sullen  Thought Process  Coherency WDL  Content WDL  Delusions None reported or observed  Perception WDL  Hallucination None reported or observed  Judgment Poor  Confusion None  Danger to Self  Current suicidal ideation? Denies  Self-Injurious Behavior No self-injurious ideation or behavior indicators observed or expressed   Agreement Not to Harm Self Yes  Description of Agreement verbally agrees to notify staff immediately for any thoughts of hurting herself or anyone else  Danger to Others  Danger to Others None reported or observed

## 2020-08-17 NOTE — Plan of Care (Signed)
Nurse discussed anxiety, depression and coping skills with patient.  

## 2020-08-17 NOTE — ED Notes (Signed)
Pt discharged from facility. Safe Transport services provided to [facility]. Report previously called to [facility]. Safety maintained. Pt alert, orient and ambulatory.

## 2020-08-17 NOTE — ED Notes (Signed)
Report called to [facility]. Safe transport called for transportation services. Safety maintained and will continue to monitor Pt.

## 2020-08-17 NOTE — Discharge Instructions (Signed)
Transfer to bhh °

## 2020-08-17 NOTE — BHH Group Notes (Signed)
Occupational Therapy Group Note Date: 08/17/2020 Group Topic/Focus: Socialization/Social Skills  Group Description: Group encouraged increased engagement and participation through discussion focused on "Breaking Down Barriers." Patients were given a handout to fill out and engage in a structured discussion around barrier to treatment including grief, guilt, substance use, etc. Discussion focused on strategies to combat barriers identified.   Participation Level: Patient did not attend OT group session despite personal invitation.    Plan: Continue to engage patient in OT groups 2 - 3x/week.  08/17/2020  Cosimo Schertzer, MOT, OTR/L  

## 2020-08-17 NOTE — ED Notes (Signed)
Pt resting at present, no distress noted, calm & cooperative.  Monitoring for safety. 

## 2020-08-17 NOTE — ED Provider Notes (Signed)
FBC/OBS ASAP Discharge Summary  Date and Time: 08/17/2020 11:55 AM  Name: Guy [name]  MRN:  [id number]   Discharge Diagnoses:  Final diagnoses:  Substance induced mood disorder (HCC)    Subjective:  Patient interviewed this AM, he is calm and cooperative and sad appearing and makes minimal eye contact. Pt reports continued SI with a plan to kill himself with a knife. Pt states that he has been dealing with numerous stressors, including his brother committing suicide with the last year, homelessness for the past 2 months, and his boyfriend being currently incarcerated, as well as a less recent stressor of his mother moving back to [facility] (2019). Pt denies HI/AVH. Pt is unable to contract for safety. He reports almost daily alcohol use; states that at times he will become tremulous (not noted on exam this morning) and diaphoresis. He denies alcohol withdrawal seizures. Pt informed of plan for psychiatric admission at this time; verbalized understanding.   Stay Summary:  Guy [name] is a 25 year old male with a history of ADHD, bipolar disorder, PTSD, and depression who presented to the [facility] 08/14/20 for evaluation of depression and suicidal ideations due to stress. Pt was transferred to the [facility] on 08/16/20; prior to [facility] transfer, patient had been faxed out for inpatient psychiatric admission..  Patient was found to have knife in his possession which was confiscated by Patent examiner after he attempted to cut himself. Patient was noted to recently had ingested alcohol; BAC 357. Denies any other illicit substance use; UDS was negative. Patient continues to endorse suicidal ideations and increased depressive symptoms related to being homeless and losing a brother to suicide in the past year. Patient states his mother "left him" and moved to [place] in 2019; states he has no contact with mother and no local supports. Patient states that he does not have any current outpatient services.    Patient reports history of "bipolar-depression" that was diagnosed in 2004. Patient states he has previously taken "Risperidal, Depakote, and just about everything else".   On assessment patient is restricted and somewhat irritable; eye contact fleeting. Provider attempted to provide reassurance several times during assessment and assured patient that all services being offered were voluntary. Patient is currently reporting anhedonia, decreased sleep and appetite, depressive mood, increased mood swings, increased alcohol consumption, thoughts of hopelessness and worthlessness, intrusive thoughts of suicide, and feelings of guilt. Patient endorsing suicidal ideations with intent, plan unclear. He denies homicidal ideations, auditory or visual hallucinations, and does not appear to be responding to any external/internal ideations at this time. Patient was later apologetic expressing frustration with his life and feelings of hopelessness. Patient was kept overnight for stabilization and safety and was restarted on reported home medications. The following day, patient was unable to contract for safety, continued to report SI. Patient was accepted by [facility]   Total Time spent with patient: 20 minutes  Past Psychiatric History: self reported bipolar depression, ADHD, PTSD, depression Past Medical History:  Past Medical History:  Diagnosis Date  . ADHD (attention deficit hyperactivity disorder)   . Bipolar 1 disorder (HCC)   . Depression   . Migraine   . PTSD (post-traumatic stress disorder)   . Sickle cell trait [facility])     Past Surgical History:  Procedure Laterality Date  . KNEE SURGERY    . WISDOM TOOTH EXTRACTION     Family History:  Family History  Problem Relation Age of Onset  . Sickle cell trait Father  Family Psychiatric History: see H&P Social History:  Social History   Substance and Sexual Activity  Alcohol Use Yes     Social History   Substance and Sexual Activity   Drug Use Yes  . Types: Marijuana    Social History   Socioeconomic History  . Marital status: Single    Spouse name: Not on file  . Number of children: Not on file  . Years of education: Not on file  . Highest education level: Not on file  Occupational History  . Not on file  Tobacco Use  . Smoking status: Current Every Day Smoker    Packs/day: 1.00  . Smokeless tobacco: Never Used  Substance and Sexual Activity  . Alcohol use: Yes  . Drug use: Yes    Types: Marijuana  . Sexual activity: Not on file  Other Topics Concern  . Not on file  Social History Narrative  . Not on file   Social Determinants of Health   Financial Resource Strain: Not on file  Food Insecurity: Not on file  Transportation Needs: Not on file  Physical Activity: Not on file  Stress: Not on file  Social Connections: Not on file   SDOH:  SDOH Screenings   Alcohol Screen: Not on file  Depression (NTI1-4): Not on file  Financial Resource Strain: Not on file  Food Insecurity: Not on file  Housing: Not on file  Physical Activity: Not on file  Social Connections: Not on file  Stress: Not on file  Tobacco Use: High Risk  . Smoking Tobacco Use: Current Every Day Smoker  . Smokeless Tobacco Use: Never Used  Transportation Needs: Not on file    Has this patient used any form of tobacco in the last 30 days? (Cigarettes, Smokeless Tobacco, Cigars, and/or Pipes) Prescription not provided because: n/a  Current Medications:  Current Facility-Administered Medications  Medication Dose Route Frequency Provider Last Rate Last Admin  . acetaminophen (TYLENOL) tablet 650 mg  650 mg Oral Q6H PRN Jaclyn Shaggy, PA-C      . alum & mag hydroxide-simeth (MAALOX/MYLANTA) 200-200-20 MG/5ML suspension 30 mL  30 mL Oral Q4H PRN Jaclyn Shaggy, PA-C      . divalproex (DEPAKOTE ER) 24 hr tablet 250 mg  250 mg Oral Daily Melbourne Abts W, PA-C   250 mg at 08/17/20 4315  . gabapentin (NEURONTIN) capsule 300 mg  300 mg  Oral BID Leevy-Johnson, Brooke A, NP   300 mg at 08/17/20 4008  . hydrOXYzine (ATARAX/VISTARIL) tablet 25 mg  25 mg Oral TID PRN Jaclyn Shaggy, PA-C   25 mg at 08/16/20 2134  . magnesium hydroxide (MILK OF MAGNESIA) suspension 30 mL  30 mL Oral Daily PRN Melbourne Abts W, PA-C      . risperiDONE (RISPERDAL) tablet 2 mg  2 mg Oral Daily Melbourne Abts W, PA-C   2 mg at 08/17/20 6761  . traZODone (DESYREL) tablet 50 mg  50 mg Oral QHS PRN Jaclyn Shaggy, PA-C   50 mg at 08/16/20 2137   Current Outpatient Medications  Medication Sig Dispense Refill  . divalproex (DEPAKOTE ER) 250 MG 24 hr tablet Take 250 mg by mouth daily.    . methylphenidate 36 MG PO CR tablet Take 36 mg by mouth daily.    . risperiDONE (RISPERDAL) 2 MG tablet Take 2 mg by mouth daily.      PTA Medications: (Not in a hospital admission)   Musculoskeletal  Strength & Muscle Tone: within  normal limits Gait & Station: normal Patient leans: N/A  Psychiatric Specialty Exam  Presentation  General Appearance: Appropriate for Environment; Casual  Eye Contact:Fleeting; Minimal  Speech:Clear and Coherent; Normal Rate  Speech Volume:Decreased  Handedness:Right   Mood and Affect  Mood:Dysphoric; Irritable; Hopeless  Affect:Appropriate; Congruent; Depressed; Blunt   Thought Process  Thought Processes:Linear; Coherent  Descriptions of Associations:Intact  Orientation:Full (Time, Place and Person)  Thought Content:WDL  Hallucinations:Hallucinations: None  Ideas of Reference:None  Suicidal Thoughts:Suicidal Thoughts: Yes, Active SI Active Intent and/or Plan: With Intent; With Plan  Homicidal Thoughts:Homicidal Thoughts: No   Sensorium  Memory:Immediate Good; Recent Fair; Remote Fair  Judgment:Fair  Insight:Other (comment) (limited)   Executive Functions  Concentration:Fair  Attention Span:Good  Recall:Fair  Fund of Knowledge:Fair  Language:Good   Psychomotor Activity  Psychomotor  Activity:Psychomotor Activity: Normal   Assets  Assets:Desire for Improvement; Resilience   Sleep  Sleep:Sleep: Fair   Physical Exam  Physical Exam Constitutional:      Appearance: Normal appearance. She is normal weight.  HENT:     Head: Normocephalic and atraumatic.  Eyes:     Extraocular Movements: Extraocular movements intact.  Pulmonary:     Effort: Pulmonary effort is normal.  Neurological:     Mental Status: She is alert.    Review of Systems  Constitutional: Negative for chills and fever.  Respiratory: Negative for cough.   Cardiovascular: Negative for chest pain.  Neurological: Negative for speech change.  Psychiatric/Behavioral: Positive for depression, substance abuse and suicidal ideas. Negative for hallucinations.   Blood pressure 124/90, pulse 84, temperature 98.2 F (36.8 C), temperature source Temporal, resp. rate 16, SpO2 100 %. There is no height or weight on file to calculate BMI.  Demographic Factors:  Adolescent or young adult, Low socioeconomic status and Living alone  Loss Factors: Financial problems/change in socioeconomic status  Historical Factors: Prior suicide attempts, Family history of suicide and Impulsivity  Risk Reduction Factors:   NA  Continued Clinical Symptoms:  Alcohol/Substance Abuse/Dependencies Previous Psychiatric Diagnoses and Treatments  Cognitive Features That Contribute To Risk:  None    Suicide Risk:  Moderate:  Frequent suicidal ideation with limited intensity, and duration, some specificity in terms of plans, no associated intent, good self-control, limited dysphoria/symptomatology, some risk factors present, and identifiable protective factors, including available and accessible social support.  Plan Of Care/Follow-up recommendations:  Transfer to bhh  Disposition: transfer to bhh  Estella Husk, MD 08/17/2020, 11:55 AM

## 2020-08-18 DIAGNOSIS — F3130 Bipolar disorder, current episode depressed, mild or moderate severity, unspecified: Principal | ICD-10-CM

## 2020-08-18 MED ORDER — DIVALPROEX SODIUM ER 500 MG PO TB24
500.0000 mg | ORAL_TABLET | Freq: Every day | ORAL | Status: AC
  Administered 2020-08-18: 500 mg via ORAL
  Filled 2020-08-18 (×2): qty 1

## 2020-08-18 MED ORDER — DIVALPROEX SODIUM ER 250 MG PO TB24
750.0000 mg | ORAL_TABLET | Freq: Every day | ORAL | Status: DC
Start: 2020-08-19 — End: 2020-08-22
  Administered 2020-08-19 – 2020-08-22 (×4): 750 mg via ORAL
  Filled 2020-08-18: qty 3
  Filled 2020-08-18: qty 21
  Filled 2020-08-18 (×4): qty 3

## 2020-08-18 NOTE — Progress Notes (Signed)
   08/18/20 0642  Vital Signs  Temp 97.8 F (36.6 C)  Temp Source Oral  Pulse Rate 80  BP 124/83  BP Location Right Arm  BP Method Automatic  Patient Position (if appropriate) Sitting   D: Patient denies SI/HI/AVH. Patient rated anxiety 1/10 and depression 3/10. Patient out in open areas and was social with peers and staff.  A:  Patient took scheduled medicine.  Support and encouragement provided Routine safety checks conducted every 15 minutes. Patient  Informed to notify staff with any concerns.   R: Safety maintained.

## 2020-08-18 NOTE — BHH Group Notes (Signed)
Adult Psychoeducational Group Note  Date:  08/18/2020 Time:  10:55 PM  Group Topic/Focus:  Wrap-Up Group:   The focus of this group is to help patients review their daily goal of treatment and discuss progress on daily workbooks.  Participation Level:  Active  Participation Quality:  Attentive  Affect:  Appropriate  Cognitive:  Appropriate  Insight: Good  Engagement in Group:  Engaged  Modes of Intervention:  Discussion  Additional Comments  Jacalyn Lefevre 08/18/2020, 10:55 PM

## 2020-08-18 NOTE — BHH Counselor (Signed)
CSW gave pt homelessness resource packet.   Fredirick Lathe, LCSWA Clinicial Social Worker [employer]

## 2020-08-18 NOTE — Progress Notes (Signed)
Recreation Therapy Notes  Animal-Assisted Activity (AAA) Program Checklist/Progress Notes Patient Eligibility Criteria Checklist & Daily Group note for Rec Tx Intervention  Date: 2.22.22 Time: 1430 Location: [address]   AAA/T Program Assumption of Risk Form signed by Engineer, production or Parent Legal Guardian YES   Patient is free of allergies or severe asthma YES  Patient reports no fear of animals YES   Patient reports no history of cruelty to animals YES  Patient understands his/her participation is voluntary YES  Patient washes hands before animal contact YES   Patient washes hands after animal contact YES   Behavioral Response: Engaged  Education: Charity fundraiser, Appropriate Animal Interaction   Education Outcome: Acknowledges understanding/In group clarification offered/Needs additional education.   Clinical Observations/Feedback: Pt attended and participated in activity.    Caroll Rancher, LRT/CTRS         Caroll Rancher A 08/18/2020 3:54 PM

## 2020-08-18 NOTE — BHH Suicide Risk Assessment (Signed)
[facility] Admission Suicide Risk Assessment   Nursing information obtained from:  Patient Demographic factors:  Male,Low socioeconomic status,Unemployed Current Mental Status:  Suicidal ideation indicated by patient,Self-harm thoughts Loss Factors:  Decrease in vocational status,Loss of significant relationship,Financial problems / change in socioeconomic status Historical Factors:  Family history of suicide,Impulsivity Risk Reduction Factors:  Living with another person, especially a relative  Total Time spent with patient: 15 minutes Principal Problem: Bipolar I disorder, most recent episode depressed (HCC) Diagnosis:  Principal Problem:   Bipolar I disorder, most recent episode depressed (HCC) Active Problems:   Suicidal ideations   Substance induced mood disorder (HCC)  Subjective Data: 25 year old male( transgender) presenting with SI in the context of alcohol abuse.   See Admission H &P   Continued Clinical Symptoms:  Alcohol Use Disorder Identification Test Final Score (AUDIT): 36 The "Alcohol Use Disorders Identification Test", Guidelines for Use in Primary Care, Second Edition.  World Science writer [facility]). Score between 0-7:  no or low risk or alcohol related problems. Score between 8-15:  moderate risk of alcohol related problems. Score between 16-19:  high risk of alcohol related problems. Score 20 or above:  warrants further diagnostic evaluation for alcohol dependence and treatment.   CLINICAL FACTORS:   Bipolar Disorder:   Mixed State    COGNITIVE FEATURES THAT CONTRIBUTE TO RISK:  Closed-mindedness    SUICIDE RISK:   Moderate:  Frequent suicidal ideation with limited intensity, and duration, some specificity in terms of plans, no associated intent, good self-control, limited dysphoria/symptomatology, some risk factors present, and identifiable protective factors, including available and accessible social support.  PLAN OF CARE: Continue care on inpatient  unit  I certify that inpatient services furnished can reasonably be expected to improve the patient's condition.   Clement Sayres, MD 08/18/2020, 2:47 PM

## 2020-08-18 NOTE — BHH Suicide Risk Assessment (Signed)
BHH INPATIENT:  Family/Significant Other Suicide Prevention Education  Suicide Prevention Education:  Patient Refusal for Family/Significant Other Suicide Prevention Education: The patient Guy [name] has refused to provide written consent for family/significant other to be provided Family/Significant Other Suicide Prevention Education during admission and/or prior to discharge.  Physician notified.  Felizardo Hoffmann 08/18/2020, 2:42 PM

## 2020-08-18 NOTE — H&P (Signed)
Psychiatric Admission Assessment Adult  Patient Identification: Guy [name] MRN:  [id number] Date of Evaluation:  08/18/2020 Chief Complaint:  Substance induced mood disorder (HCC) [F19.94] Principal Diagnosis: Bipolar I disorder, most recent episode depressed (HCC) Diagnosis:  Principal Problem:   Bipolar I disorder, most recent episode depressed (HCC) Active Problems:   Suicidal ideations   Substance induced mood disorder (HCC)  History of Present Illness:  [name] is a 25 year old transgender male (male to male) (he/his pronouns) who presents with depression and SI. PPHx is significant for Bipolar, Depression, ADHD, and 1 previous Hospitalization 2021.   He reports that his issues have been caused by multiple issues. He reports that his depression started when his brother killed himself last August (2021). He states that he also has has had not had the support of his mother because she moved to [place] a while ago. He reports that his boyfriend is currently in prison because he violated his parole. He reports that he is also homeless. He states all of things have made him think of killing himself for a few months now. The thoughts have been continuous.   He reports he was admitted to an inpatient unit in [facility] last year and that he then attempted to go to [facility] but was unable to because he did not have medicaid. He reports that he was placed on Bipolar, Depression, and ADHD. He reports that he has been depressed, had poor sleep and appetite, feelings of worthlessness/hopelessness/and guilt. He reports that he had a Manic episode about 2 weeks ago and that it last for about 4 hours. He reports racing thoughts, impulsivity, and high energy. He reports no AVH or anxiety symptoms. He reports that he does not have SI currently and that he has thought about it and no longer wants to hurt himself. He reports he has never attempted suicide and that he has never done any self  injurious behaviors. He reports no history of abuse.   He reports that he has been drinking more than he should since around 2018. He reports drinking 2 40's a day. He reports smoking about 1 pack of cigarettes a day. He reports no illicit drug use. He reports that he is homeless. Reports that he currently works for a [employer] Reports he does have some friends in [place]. Reports he plans to stay with one of his friends after being discharged for a while and then plans to go to [place] to live with his mother.     Per [facility] Discharge Summary- "Patient interviewed this AM, he is calm and cooperative and sad appearing and makes minimal eye contact. Pt reports continued SI with a plan to kill himself with a knife. Pt states that he has been dealing with numerous stressors, including his brother committing suicide with the last year, homelessness for the past 2 months, and his boyfriend being currently incarcerated, as well as a less recent stressor of his mother moving back to [facility] (2019). Pt denies HI/AVH. Pt is unable to contract for safety. He reports almost daily alcohol use; states that at times he will become tremulous (not noted on exam this morning) and diaphoresis. He denies alcohol withdrawal seizures. Pt informed of plan for psychiatric admission at this time; verbalized understanding.   Stay Summary:  Guy [name] is a 25 year old male with a history of ADHD, bipolar disorder, PTSD, and depression who presented to the [facility] 08/14/20 for evaluation of depression and suicidal ideations due  to stress. Pt was transferred to the [facility] on 08/16/20; prior to [facility] transfer, patient had been faxed out for inpatient psychiatric admission..  Patient was found to have knife in his possession which was confiscated by Patent examiner after he attempted to cut himself. Patient was noted to recently had ingested alcohol; BAC 357. Denies any other illicit substance use; UDS was negative. Patient  continues to endorse suicidal ideations and increased depressive symptoms related to being homeless and losing a brother to suicide in the past year. Patient states his mother "left him" and moved to [place] in 2019; states he has no contact with mother and no local supports. Patient states that he does not have any current outpatient services.   Patient reports history of "bipolar-depression" that was diagnosed in 2004. Patient states he has previously taken "Risperidal, Depakote, and just about everything else".   On assessment patient isrestricted and somewhatirritable; eye contact fleeting. Provider attempted to provide reassurance several times during assessment and assured patient that all services being offered were voluntary.Patient is currently reporting anhedonia, decreased sleep and appetite, depressive mood,increased mood swings,increased alcohol consumption, thoughts of hopelessness and worthlessness, intrusive thoughts of suicide, and feelings of guilt. Patient endorsing suicidal ideations with intent, plan unclear. He denies homicidal ideations, auditory or visual hallucinations, and does not appear to be responding to any external/internal ideations at this time.Patient was later apologetic expressing frustration with his life and feelings of hopelessness.Patient was kept overnight for stabilization and safety and was restarted on reported home medications. The following day, patient was unable to contract for safety, continued to report SI. Patient was accepted by [facility]"   Associated Signs/Symptoms: Depression Symptoms:  depressed mood, anhedonia, fatigue, feelings of worthlessness/guilt, hopelessness, suicidal thoughts without plan, loss of energy/fatigue, disturbed sleep, decreased appetite, Duration of Depression Symptoms: Greater than two weeks  (Hypo) Manic Symptoms:  Distractibility, Impulsivity, Labiality of Mood, Anxiety Symptoms:  Denies at present Psychotic  Symptoms:  Deines at present Duration of Psychotic Symptoms: No data recorded PTSD Symptoms: NA Total Time spent with patient: 30 minutes  Past Psychiatric History: Bipolar, Depression, ADHD, and 1 previous Hospitalization 2021.  Is the patient at risk to self? No.  Has the patient been a risk to self in the past 6 months? No.  Has the patient been a risk to self within the distant past? No.  Is the patient a risk to others? No.  Has the patient been a risk to others in the past 6 months? No.  Has the patient been a risk to others within the distant past? No.   Prior Inpatient Therapy:  [place] 2021 Prior Outpatient Therapy:  Reports none  Alcohol Screening: 1. How often do you have a drink containing alcohol?: 4 or more times a week 2. How many drinks containing alcohol do you have on a typical day when you are drinking?: 10 or more 3. How often do you have six or more drinks on one occasion?: Daily or almost daily AUDIT-C Score: 12 4. How often during the last year have you found that you were not able to stop drinking once you had started?: Daily or almost daily 5. How often during the last year have you failed to do what was normally expected from you because of drinking?: Daily or almost daily 6. How often during the last year have you needed a first drink in the morning to get yourself going after a heavy drinking session?: Daily or almost daily 7. How often during  the last year have you had a feeling of guilt of remorse after drinking?: Daily or almost daily 8. How often during the last year have you been unable to remember what happened the night before because you had been drinking?: Daily or almost daily 9. Have you or someone else been injured as a result of your drinking?: No 10. Has a relative or friend or a doctor or another health worker been concerned about your drinking or suggested you cut down?: Yes, during the last year Alcohol Use Disorder Identification Test  Final Score (AUDIT): 36 Alcohol Brief Interventions/Follow-up: Alcohol Education Substance Abuse History in the last 12 months:  Yes.   Consequences of Substance Abuse: NA Previous Psychotropic Medications: Yes Risperdal, Depakote, Ritalin Psychological Evaluations: No  Past Medical History:  Past Medical History:  Diagnosis Date  . ADHD (attention deficit hyperactivity disorder)   . Bipolar 1 disorder (HCC)   . Depression   . Migraine   . PTSD (post-traumatic stress disorder)   . Sickle cell trait [facility])     Past Surgical History:  Procedure Laterality Date  . KNEE SURGERY    . WISDOM TOOTH EXTRACTION     Family History:  Family History  Problem Relation Age of Onset  . Sickle cell trait Father    Family Psychiatric  History: Brother-Suicide 2021 Tobacco Screening: Have you used any form of tobacco in the last 30 days? (Cigarettes, Smokeless Tobacco, Cigars, and/or Pipes): Yes Tobacco use, Select all that apply: 5 or more cigarettes per day Are you interested in Tobacco Cessation Medications?: Yes, will notify MD for an order Counseled patient on smoking cessation including recognizing danger situations, developing coping skills and basic information about quitting provided: Yes Social History:  Social History   Substance and Sexual Activity  Alcohol Use Yes  . Alcohol/week: 5.0 standard drinks  . Types: 5 Cans of beer per week   Comment: five, 40 oz beers daily     Social History   Substance and Sexual Activity  Drug Use Not Currently    Additional Social History:      Pain Medications: see MAR Prescriptions: see MAR Over the Counter: see MAR History of alcohol / drug use?: Yes Longest period of sobriety (when/how long): unsure Negative Consequences of Use: Financial,Personal relationships Withdrawal Symptoms: Other (Comment) (anxiety, depression) Name of Substance 1: alcohol 1 - Age of First Use: 25 yrs old 1 - Amount (size/oz): five 40 ozs daily 1 -  Frequency: daily 1 - Duration: three yrs 1 - Last Use / Amount: Friday 1 - Method of Aquiring: buy 1- Route of Use: p.o.                  Allergies:   Allergies  Allergen Reactions  . Shellfish Allergy Anaphylaxis   Lab Results:  Results for orders placed or performed during the hospital encounter of 08/16/20 (from the past 48 hour(s))  Hemoglobin A1c     Status: Abnormal   Collection Time: 08/16/20 10:00 PM  Result Value Ref Range   Hgb A1c MFr Bld 6.0 (H) 4.8 - 5.6 %    Comment: (NOTE) Pre diabetes:          5.7%-6.4%  Diabetes:              >6.4%  Glycemic control for   <7.0% adults with diabetes    Mean Plasma Glucose 125.5 mg/dL    Comment: Performed at [facility], [address], [place], [place]  [address]  Lipid panel     Status: None   Collection Time: 08/16/20 10:00 PM  Result Value Ref Range   Cholesterol 200 0 - 200 mg/dL   Triglycerides 604 <540 mg/dL   HDL 92 >98 mg/dL   Total CHOL/HDL Ratio 2.2 RATIO   VLDL 23 0 - 40 mg/dL   LDL Cholesterol 85 0 - 99 mg/dL    Comment:        Total Cholesterol/HDL:CHD Risk Coronary Heart Disease Risk Table                     Men   Women  1/2 Average Risk   3.4   3.3  Average Risk       5.0   4.4  2 X Average Risk   9.6   7.1  3 X Average Risk  23.4   11.0        Use the calculated Patient Ratio above and the CHD Risk Table to determine the patient's CHD Risk.        ATP III CLASSIFICATION (LDL):  <100     mg/dL   Optimal  119-147  mg/dL   Near or Above                    Optimal  130-159  mg/dL   Borderline  829-562  mg/dL   High  >130     mg/dL   Very High Performed at [facility], [address] [place], [place] [address]   TSH     Status: None   Collection Time: 08/16/20 10:00 PM  Result Value Ref Range   TSH 3.872 0.350 - 4.500 uIU/mL    Comment: Performed by a 3rd Generation assay with a functional sensitivity of <=0.01 uIU/mL. Performed at [facility], [address], [place], [place] [address]   Valproic acid level     Status: Abnormal   Collection Time: 08/16/20 10:00 PM  Result Value Ref Range   Valproic Acid Lvl <10 (L) 50.0 - 100.0 ug/mL    Comment: RESULTS CONFIRMED BY MANUAL DILUTION Performed at [facility], [address], [place], [place] [address]   POC SARS Coronavirus 2 Ag     Status: None   Collection Time: 08/17/20 11:54 AM  Result Value Ref Range   SARS Coronavirus 2 Ag NEGATIVE NEGATIVE    Comment: (NOTE) SARS-CoV-2 antigen NOT DETECTED.   Negative results are presumptive.  Negative results do not preclude SARS-CoV-2 infection and should not be used as the sole basis for treatment or other patient management decisions, including infection  control decisions, particularly in the presence of clinical signs and  symptoms consistent with COVID-19, or in those who have been in contact with the virus.  Negative results must be combined with clinical observations, patient history, and epidemiological information. The expected result is Negative.  Fact Sheet for Patients: https://www.jennings-kim.com/  Fact Sheet for Healthcare Providers: https://-rogers.biz/  This test is not yet approved or cleared by the [place] FDA and  has been authorized for detection and/or diagnosis of SARS-CoV-2 by FDA under an Emergency Use Authorization (EUA).  This EUA will remain in effect (meaning this test can be used) for the duration of  the COV ID-19 declaration under Section 564(b)(1) of the Act, 21 U.S.C. section 360bbb-3(b)(1), unless the authorization is terminated or revoked sooner.      Blood Alcohol level:  Lab Results  Component  Value Date   ETH 357 [facility]) 08/14/2020   ETH 272 (H) 09/07/2019    Metabolic Disorder Labs:  Lab Results  Component Value Date   HGBA1C 6.0 (H) 08/16/2020   MPG 125.5 08/16/2020   No results found for: PROLACTIN Lab Results  Component Value Date   CHOL 200  08/16/2020   TRIG 116 08/16/2020   HDL 92 08/16/2020   CHOLHDL 2.2 08/16/2020   VLDL 23 08/16/2020   LDLCALC 85 08/16/2020    Current Medications: Current Facility-Administered Medications  Medication Dose Route Frequency Provider Last Rate Last Admin  . acetaminophen (TYLENOL) tablet 650 mg  650 mg Oral Q6H PRN Estella Husk, MD   650 mg at 08/17/20 2103  . alum & mag hydroxide-simeth (MAALOX/MYLANTA) 200-200-20 MG/5ML suspension 30 mL  30 mL Oral Q4H PRN Estella Husk, MD      . divalproex (DEPAKOTE ER) 24 hr tablet 500 mg  500 mg Oral Daily Reyonna Haack, Mardelle Matte, MD      . Melene Muller ON 08/19/2020] divalproex (DEPAKOTE ER) 24 hr tablet 750 mg  750 mg Oral Daily Laurens Matheny, Mardelle Matte, MD      . hydrOXYzine (ATARAX/VISTARIL) tablet 25 mg  25 mg Oral TID PRN Estella Husk, MD   25 mg at 08/17/20 1853  . hydrOXYzine (ATARAX/VISTARIL) tablet 25 mg  25 mg Oral Q6H PRN Estella Husk, MD      . loperamide (IMODIUM) capsule 2-4 mg  2-4 mg Oral PRN Estella Husk, MD      . LORazepam (ATIVAN) tablet 1 mg  1 mg Oral Q6H PRN Estella Husk, MD      . magnesium hydroxide (MILK OF MAGNESIA) suspension 30 mL  30 mL Oral Daily PRN Estella Husk, MD      . multivitamin with minerals tablet 1 tablet  1 tablet Oral Daily Estella Husk, MD   1 tablet at 08/18/20 0756  . nicotine polacrilex (NICORETTE) gum 2 mg  2 mg Oral PRN Antonieta Pert, MD   2 mg at 08/17/20 1853  . ondansetron (ZOFRAN-ODT) disintegrating tablet 4 mg  4 mg Oral Q6H PRN Estella Husk, MD      . risperiDONE (RISPERDAL) tablet 2 mg  2 mg Oral Daily Estella Husk, MD   2 mg at 08/18/20 0756  . thiamine (B-1) injection 100 mg  100 mg Intramuscular Once Estella Husk, MD      . thiamine tablet 100 mg  100 mg Oral Daily Estella Husk, MD   100 mg at 08/18/20 0756  . traZODone (DESYREL) tablet 50 mg  50 mg Oral QHS PRN Estella Husk, MD   50 mg at 08/17/20  2103   PTA Medications: Medications Prior to Admission  Medication Sig Dispense Refill Last Dose  . divalproex (DEPAKOTE ER) 250 MG 24 hr tablet Take 250 mg by mouth daily.     . risperiDONE (RISPERDAL) 2 MG tablet Take 2 mg by mouth daily.       Musculoskeletal: Strength & Muscle Tone: within normal limits Gait & Station: normal Patient leans: N/A  Psychiatric Specialty Exam: Physical Exam Vitals and nursing note reviewed.  Constitutional:      Appearance: Normal appearance. She is normal weight.  HENT:     Head: Normocephalic and atraumatic.  Cardiovascular:     Rate and Rhythm: Normal rate.  Pulmonary:     Effort: Pulmonary effort is normal.  Musculoskeletal:  General: Normal range of motion.  Neurological:     General: No focal deficit present.     Mental Status: She is alert.     Review of Systems  Constitutional: Negative for fatigue and fever.  Respiratory: Negative for chest tightness and shortness of breath.   Cardiovascular: Negative for chest pain and palpitations.  Gastrointestinal: Negative for abdominal pain, constipation, diarrhea, nausea and vomiting.  Neurological: Negative for dizziness, weakness, light-headedness and headaches.  Psychiatric/Behavioral: Negative for suicidal ideas.    Blood pressure 124/83, pulse 80, temperature 97.8 F (36.6 C), temperature source Oral, resp. rate 18, height  (1.702 m), weight 81.6 kg, SpO2 100 %.Body mass index is 28.19 kg/m.  General Appearance: Casual  Eye Contact:  Fair  Speech:  Clear and Coherent and Normal Rate  Volume:  Normal  Mood:  Depressed  Affect:  Flat  Thought Process:  Coherent and Goal Directed  Orientation:  Full (Time, Place, and Person)  Thought Content:  Logical  Suicidal Thoughts:  No  Homicidal Thoughts:  No  Memory:  Immediate;   Fair Recent;   Fair  Judgement:  Poor  Insight:  Shallow  Psychomotor Activity:  Normal  Concentration:  Concentration: Fair  Recall:  Fair   Fund of Knowledge:  Good  Language:  Good  Akathisia:  Negative  Handed:  Right  AIMS (if indicated):     Assets:  Physical Health Resilience  ADL's:  Intact  Cognition:  WNL  Sleep:  Number of Hours: 6.75    Treatment Plan Summary: Daily contact with patient to assess and evaluate symptoms and progress in treatment   Mr. Mccleery is a 25 year old transgender male (male to male) (he/his pronouns) who presents with depression and SI. PPHx is significant for Bipolar, Depression, ADHD, and 1 previous Hospitalization 2021.   He is currently denying SI but states he still has significant depression. Given the Depakote level being subtherapeutic will increase the dose. Will not make any changes to the Risperdal. Social Work told me that patient has declined consent for them to contact anyone will discuss the importance of being able to contact persons to establish a Safety Plan for discharge.   Bipolar Depressive Episode: -Continue Risperdal 2 mg daily -Increase Depakote ER to 750 mg daily tomorrow -Will give one dose of Depakote 500 mg today   Withdrawal: -Continue CIWA -Continue Ativan 1 mg q6 PRN   Nicotine Dependence: -Continue Nicorette gum 2 mg PRN   -Continue PRN's: Tylenol, Maalox, Atarax, Milk of Magnesia, Trazodone    Observation Level/Precautions:  15 minute checks  Laboratory: A1C: 6.0 Pre-Diabetic Lipid Panel: WNL  TSH: 3.872 (WNL)  CBC: WNL   CMP:WNL   Acetaminophen/Salicylate:WNL  Ethanol:  357 (High)   Valproic Acid: 10 Subtherapeutic  Psychotherapy:    Medications:    Consultations:    Discharge Concerns:    Estimated LOS: 4-6 days  Other:     Physician Treatment Plan for Primary Diagnosis: Bipolar I disorder, most recent episode depressed (HCC) Long Term Goal(s): Improvement in symptoms so as ready for discharge  Short Term Goals: Ability to identify changes in lifestyle to reduce recurrence of condition will improve, Ability to verbalize  feelings will improve, Ability to disclose and discuss suicidal ideas, Ability to demonstrate self-control will improve, Ability to identify and develop effective coping behaviors will improve and Ability to identify triggers associated with substance abuse/mental health issues will improve  Physician Treatment Plan for Secondary Diagnosis: Principal Problem:  Bipolar I disorder, most recent episode depressed (HCC) Active Problems:   Suicidal ideations   Substance induced mood disorder (HCC)  Long Term Goal(s): Improvement in symptoms so as ready for discharge  Short Term Goals: Ability to identify changes in lifestyle to reduce recurrence of condition will improve, Ability to verbalize feelings will improve, Ability to disclose and discuss suicidal ideas, Ability to demonstrate self-control will improve, Ability to identify and develop effective coping behaviors will improve and Ability to identify triggers associated with substance abuse/mental health issues will improve  I certify that inpatient services furnished can reasonably be expected to improve the patient's condition.    Lauro FranklinAlexander S Klara Stjames, MD 2/22/20222:11 PM

## 2020-08-18 NOTE — [facility] Counselor (Signed)
Adult Comprehensive Assessment  Patient ID: Guy [name], adult   DOB: Jun 02, 1996, 25 y.o.   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 Housing / Lack of housing: Has been homeless for 2 months Social relationships: states his boyfriend went to prison in Oct 26, 2018 Substance abuse: drinks 2 40's daily Bereavement / Loss: reports younger brother passed away in 10-26-2019  Living/Environment/Situation:  Living Arrangements: Alone Living conditions (as described by patient or guardian): Homeless Who else lives in the home?: Alone How long has patient lived in current situation?: 2 months  Family History:  Marital status: Long term relationship Long term relationship, how long?: 5 years What types of issues is patient dealing with in the relationship?: pt's boyfriend is currently in prison Are you sexually active?: No What is your sexual orientation?: "gay" Does patient have children?: No  Childhood History:  By whom was/is the patient raised?: Mother Additional childhood history information: Reports his father was never in his life Description of patient's relationship with caregiver when they were a child: Mom: "difficult: Patient's description of current relationship with people who raised him/her: Mom: "good now, she lives in [place]" How were you disciplined when you got in trouble as a child/adolescent?: "privileges taken" Does patient have siblings?: Yes Number of Siblings: 2 Description of patient's current relationship with siblings: good relationship with one brother  the other is deceased Did patient suffer any verbal/emotional/physical/sexual abuse as a child?: No Did patient suffer from severe childhood neglect?: No Has patient ever been sexually abused/assaulted/raped as an adolescent or adult?: No Was the patient ever a victim of a crime or a disaster?: No Witnessed domestic violence?: No Has patient been affected by domestic violence as an adult?: No  Education:  Highest grade of school patient has completed: Engineer, agricultural Currently a student?: No Learning disability?: No  Employment/Work Situation:   Employment situation: Employed 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 How long has patient been employed?: 2 years Patient's job has been impacted by current illness: No 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 Where was the patient employed at that time?: 2 years Has patient ever been in the [employer]?: No  Financial Resources:   Financial resources: Income from employment Does patient have a representative payee or guardian?: No  Alcohol/Substance Abuse:   What has been your use of drugs/alcohol within the last 12 months?: Reports drinking "2 40's" of alcohol daily If attempted suicide, did drugs/alcohol play a role in this?: No Alcohol/Substance Abuse Treatment Hx: Denies past history Has alcohol/substance abuse ever caused legal problems?: No  Social Support System:   Conservation officer, nature Support System: Fair Development worker, community Support System: "nobody"  Leisure/Recreation:   Do You Have Hobbies?: No  Strengths/Needs:   What is the patient's perception of their strengths?: "I don't know"  Discharge Plan:   Currently receiving community mental health services: No Patient states concerns and preferences for aftercare planning are: interested in [facility] Does patient have access to transportation?: No Does patient have financial barriers related to discharge medications?: No Plan for no access  to transportation at discharge: CSW  will arrange at discharge Plan for living situation after discharge: states he will go live with friends in [place] and then go to [place] with his mother Will patient be returning to same living situation after discharge?: No  Summary/Recommendations:   Summary and Recommendations (to be completed by the evaluator): Guy [name] is a 25 year old male who presented to [facility] for depression and suicidial ideation. While at [facility], pt would like to work on "maintaining and keeping positive". 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 Pt currently is homeless and has been for 2 months. Pt is currently in a 5-year relationship and identifies as gay. Pt reports his boyfriend is currently in prison. Pt reports that they are not currently sexually active. Pt reports that they have 0 kids. Pt was raised by his mother and reports that the relationship was difficult as they grew up. Pt reports that their current relationship with their caretaker is "good". Pt reports were not verbally/emotionally/physically/sexually abused as a child. Pt reports they were not sexually abused/assaulted/raped as a teenager or an adult. T Pt's highest level of education is 12th grade. 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 Pt reports drinking 2 40's of alcohol daily. Pt describes their support system as Fair and states "nobody" is a part of it. Pt currently sees no outpatient providers. Pt will live at "a friend's home in [place]" when they discharge and will need transportation.  While here, Guy [name] can benefit from crisis stabilization, medication management, therapeutic milieu, and referrals for services.  Guy [name]. 08/18/2020

## 2020-08-19 NOTE — Progress Notes (Signed)
Patient presents with flat affect. His interaction is minimal but he is cooperative in answering assessment questions. He denies SI, HI, and AVH. He rates his depression as a 2 and anxiety as a 1 this morning. He stated his goal for today is to stay positive. Patient denies any withdrawal symptoms.   Medications were given per provider orders. Patient is compliant with all medications. Support and encouragement is provided. Patient remains safe on the unit at this time and q15 minute safety checks are maintained.

## 2020-08-19 NOTE — Progress Notes (Signed)
The patient expressed in group that what he learned today is to have "good communication" with people. His goal for tomorrow is to be "focused" and "stay positive".

## 2020-08-19 NOTE — Progress Notes (Signed)
Recreation Therapy Notes  Date: 2.23.22 Time: 0930 Location: 300 Hall Dayroom  Group Topic: Stress Management  Goal Area(s) Addresses:  Patient will identify positive stress management techniques. Patient will identify benefits of using stress management post d/c.  Behavioral Response: Engaged  Intervention: Stress Management  Activity:  Meditation.  LRT played a meditation that focused on extending love and kindness to self and others.  Patients were to listen and follow along as meditation played to engage in activity.    Education:  Stress Management, Discharge Planning.   Education Outcome: Acknowledges Education  Clinical Observations/Feedback: Pt attended and participated in group activity.    Caroll Rancher, LRT/CTRS         Caroll Rancher A 08/19/2020 11:04 AM

## 2020-08-19 NOTE — Tx Team (Signed)
Interdisciplinary Treatment and Diagnostic Plan Update  08/19/2020 Time of Session: 9:05am Guy [name] MRN: [id number]  Principal Diagnosis: Bipolar I disorder, most recent episode depressed ([place])  Secondary Diagnoses: Principal Problem:   Bipolar I disorder, most recent episode depressed ([place]) Active Problems:   Suicidal ideations   Substance induced mood disorder ([place])   Current Medications:  Current Facility-Administered Medications  Medication Dose Route Frequency Provider Last Rate Last Admin  . acetaminophen (TYLENOL) tablet 650 mg  650 mg Oral Q6H PRN Ival Bible, MD   650 mg at 08/17/20 2103  . alum & mag hydroxide-simeth (MAALOX/MYLANTA) 200-200-20 MG/5ML suspension 30 mL  30 mL Oral Q4H PRN Ival Bible, MD      . divalproex (DEPAKOTE ER) 24 hr tablet 750 mg  750 mg Oral Daily Briant Cedar, MD   750 mg at 08/19/20 2993  . hydrOXYzine (ATARAX/VISTARIL) tablet 25 mg  25 mg Oral TID PRN Ival Bible, MD   25 mg at 08/18/20 2109  . hydrOXYzine (ATARAX/VISTARIL) tablet 25 mg  25 mg Oral Q6H PRN Ival Bible, MD      . loperamide (IMODIUM) capsule 2-4 mg  2-4 mg Oral PRN Ival Bible, MD      . LORazepam (ATIVAN) tablet 1 mg  1 mg Oral Q6H PRN Ival Bible, MD      . magnesium hydroxide (MILK OF MAGNESIA) suspension 30 mL  30 mL Oral Daily PRN Ival Bible, MD      . multivitamin with minerals tablet 1 tablet  1 tablet Oral Daily Ival Bible, MD   1 tablet at 08/19/20 7169  . nicotine polacrilex (NICORETTE) gum 2 mg  2 mg Oral PRN Sharma Covert, MD   2 mg at 08/17/20 1853  . ondansetron (ZOFRAN-ODT) disintegrating tablet 4 mg  4 mg Oral Q6H PRN Ival Bible, MD      . risperiDONE (RISPERDAL) tablet 2 mg  2 mg Oral Daily Ival Bible, MD   2 mg at 08/19/20 6789  . thiamine (B-1) injection 100 mg  100 mg Intramuscular Once Ival Bible, MD      . thiamine tablet 100 mg  100 mg  Oral Daily Ival Bible, MD   100 mg at 08/19/20 3810  . traZODone (DESYREL) tablet 50 mg  50 mg Oral QHS PRN Ival Bible, MD   50 mg at 08/17/20 2103   PTA Medications: Medications Prior to Admission  Medication Sig Dispense Refill Last Dose  . divalproex (DEPAKOTE ER) 250 MG 24 hr tablet Take 250 mg by mouth daily.     . risperiDONE (RISPERDAL) 2 MG tablet Take 2 mg by mouth daily.       Patient Stressors: Financial difficulties Medication change or noncompliance Substance abuse  Patient Strengths: Capable of independent living Agricultural engineer for treatment/growth  Treatment Modalities: Medication Management, Group therapy, Case management,  1 to 1 session with clinician, Psychoeducation, Recreational therapy.   Physician Treatment Plan for Primary Diagnosis: Bipolar I disorder, most recent episode depressed ([place]) Long Term Goal(s): Improvement in symptoms so as ready for discharge Improvement in symptoms so as ready for discharge   Short Term Goals: Ability to identify changes in lifestyle to reduce recurrence of condition will improve Ability to verbalize feelings will improve Ability to disclose and discuss suicidal ideas Ability to demonstrate self-control will improve Ability to identify and develop effective coping behaviors will improve Ability to identify triggers  associated with substance abuse/mental health issues will improve Ability to identify changes in lifestyle to reduce recurrence of condition will improve Ability to verbalize feelings will improve Ability to disclose and discuss suicidal ideas Ability to demonstrate self-control will improve Ability to identify and develop effective coping behaviors will improve Ability to identify triggers associated with substance abuse/mental health issues will improve  Medication Management: Evaluate patient's response, side effects, and tolerance of medication regimen.  Therapeutic  Interventions: 1 to 1 sessions, Unit Group sessions and Medication administration.  Evaluation of Outcomes: Not Met  Physician Treatment Plan for Secondary Diagnosis: Principal Problem:   Bipolar I disorder, most recent episode depressed ([place]) Active Problems:   Suicidal ideations   Substance induced mood disorder ([place])  Long Term Goal(s): Improvement in symptoms so as ready for discharge Improvement in symptoms so as ready for discharge   Short Term Goals: Ability to identify changes in lifestyle to reduce recurrence of condition will improve Ability to verbalize feelings will improve Ability to disclose and discuss suicidal ideas Ability to demonstrate self-control will improve Ability to identify and develop effective coping behaviors will improve Ability to identify triggers associated with substance abuse/mental health issues will improve Ability to identify changes in lifestyle to reduce recurrence of condition will improve Ability to verbalize feelings will improve Ability to disclose and discuss suicidal ideas Ability to demonstrate self-control will improve Ability to identify and develop effective coping behaviors will improve Ability to identify triggers associated with substance abuse/mental health issues will improve     Medication Management: Evaluate patient's response, side effects, and tolerance of medication regimen.  Therapeutic Interventions: 1 to 1 sessions, Unit Group sessions and Medication administration.  Evaluation of Outcomes: Not Met   RN Treatment Plan for Primary Diagnosis: Bipolar I disorder, most recent episode depressed ([place]) Long Term Goal(s): Knowledge of disease and therapeutic regimen to maintain health will improve  Short Term Goals: Ability to participate in decision making will improve, Ability to verbalize feelings will improve and Ability to disclose and discuss suicidal ideas  Medication Management: RN will administer medications as  ordered by provider, will assess and evaluate patient's response and provide education to patient for prescribed medication. RN will report any adverse and/or side effects to prescribing provider.  Therapeutic Interventions: 1 on 1 counseling sessions, Psychoeducation, Medication administration, Evaluate responses to treatment, Monitor vital signs and CBGs as ordered, Perform/monitor CIWA, COWS, AIMS and Fall Risk screenings as ordered, Perform wound care treatments as ordered.  Evaluation of Outcomes: Not Met   LCSW Treatment Plan for Primary Diagnosis: Bipolar I disorder, most recent episode depressed ([place]) Long Term Goal(s): Safe transition to appropriate next level of care at discharge, Engage patient in therapeutic group addressing interpersonal concerns.  Short Term Goals: Engage patient in aftercare planning with referrals and resources, Increase social support and Increase ability to appropriately verbalize feelings  Therapeutic Interventions: Assess for all discharge needs, 1 to 1 time with Social worker, Explore available resources and support systems, Assess for adequacy in community support network, Educate family and significant other(s) on suicide prevention, Complete Psychosocial Assessment, Interpersonal group therapy.  Evaluation of Outcomes: Not Met   Progress in Treatment: Attending groups: Yes. and No. Participating in groups: Yes. Taking medication as prescribed: Yes. Toleration medication: Yes. Family/Significant other contact made: No, will contact:  pt declined consents Patient understands diagnosis: No. Discussing patient identified problems/goals with staff: Yes. Medical problems stabilized or resolved: Yes. Denies suicidal/homicidal ideation: Yes. Issues/concerns per patient self-inventory: Yes. Other:  None  New problem(s) identified: No, Describe:  CSW will continue to assess  New Short Term/Long Term Goal(s):medication stabilization, elimination of SI  thoughts, development of comprehensive mental wellness plan.  Patient Goals:  "try to stay focused and get home."  Discharge Plan or Barriers: Patient recently admitted. CSW will continue to follow and assess for appropriate referrals and possible discharge planning.  Reason for Continuation of Hospitalization: Medication stabilization Suicidal ideation Withdrawal symptoms  Estimated Length of Stay: 3-5 days  Attendees: Patient: Guy [name] 08/19/2020   Physician: Lala Lund, MD 08/19/2020   Nursing:  08/19/2020   RN Care Manager: 08/19/2020   Social Worker: Toney Reil, Latanya Presser 08/19/2020   Recreational Therapist:  08/19/2020   Other:  08/19/2020   Other:  08/19/2020   Other: 08/19/2020       Scribe for Treatment Team: Mliss Fritz, Latanya Presser 08/19/2020 3:18 PM

## 2020-08-19 NOTE — Progress Notes (Signed)
   08/19/20 2241  Psychosocial Assessment  Patient Complaints None  Eye Contact Fair  Facial Expression  (smiling)  Speech Unremarkable  Interaction Assertive  Appearance/Hygiene Unremarkable  Behavior Characteristics Cooperative;Appropriate to situation  Mood Pleasant  Thought Process  Coherency WDL  Content WDL  Delusions WDL  Perception WDL  Hallucination None reported or observed  Judgment WDL  Confusion WDL  Danger to Self  Current suicidal ideation? Denies  Danger to Others  Danger to Others None reported or observed  Patient put in a 72 hour request for discharge today on previous shift. No active SI

## 2020-08-19 NOTE — Progress Notes (Signed)
Pt is minimal and guarded upon assessment. Pt denies any side effects from his medications. He reports feeling "calm and focused." He denies any anxiety or depression tonight. His goal for discharge is to attend an outpatient treatment facility. Pt denies SI/HI and AVH. Active listening, reassurance, and support provided. Medications administered as ordered by provider. Q 15 min safety checks continue. Pt's safety has been maintained.   08/18/20 2109  Psych Admission Type (Psych Patients Only)  Admission Status Voluntary  Psychosocial Assessment  Patient Complaints None  Eye Contact Brief  Facial Expression Anxious;Sullen;Sad;Pensive  Affect Anxious;Depressed;Sad;Sullen  Speech Logical/coherent;Soft;Slow  Interaction Forwards little;Minimal  Motor Activity Fidgety  Appearance/Hygiene Unremarkable  Behavior Characteristics Cooperative;Anxious  Mood Depressed;Anxious;Sad;Sullen  Thought Process  Coherency WDL  Content WDL  Delusions None reported or observed  Perception WDL  Hallucination None reported or observed  Judgment Poor  Confusion None  Danger to Self  Current suicidal ideation? Denies  Self-Injurious Behavior No self-injurious ideation or behavior indicators observed or expressed   Danger to Others  Danger to Others None reported or observed

## 2020-08-19 NOTE — Progress Notes (Signed)
[facility]  08/19/2020 7:13 AM Guy StarrRayquan L Gladhill  MRN:  [id number] Subjective:   [name] is a 25 year old transgender male (male to male) (he/his pronouns) who presents with depression and SI. PPHx is significant for Bipolar, Depression, ADHD, and 1 previous Hospitalization 2021.  He reports that he has tolerated the increase in Depakote well with no side effects. He reports that his sleep is good and that his appetite is well. He reports no SI, HI, or AVH. Encouraged him to attend group therapy to develop and refine coping skills.   Principal Problem: Bipolar I disorder, most recent episode depressed (HCC) Diagnosis: Principal Problem:   Bipolar I disorder, most recent episode depressed (HCC) Active Problems:   Suicidal ideations   Substance induced mood disorder (HCC)  Total Time spent with patient: 20 minutes  I personally spent 20 minutes on the unit in direct patient care. The direct patient care time included face-to-face time with the patient, reviewing the patient's chart, communicating with other professionals, and coordinating care. Greater than 50% of this time was spent in counseling or coordinating care with the patient regarding goals of hospitalization, psycho-education, and discharge planning needs.  Past Psychiatric History: Bipolar, Depression, ADHD, and 1 previous Hospitalization 2021.  Past Medical History:  Past Medical History:  Diagnosis Date  . ADHD (attention deficit hyperactivity disorder)   . Bipolar 1 disorder (HCC)   . Depression   . Migraine   . PTSD (post-traumatic stress disorder)   . Sickle cell trait [facility](HCC)     Past Surgical History:  Procedure Laterality Date  . KNEE SURGERY    . WISDOM TOOTH EXTRACTION     Family History:  Family History  Problem Relation Age of Onset  . Sickle cell trait Father    Family Psychiatric  History: Brother-Suicide 2021 Social History:  Social History   Substance and Sexual Activity  Alcohol  Use Yes  . Alcohol/week: 5.0 standard drinks  . Types: 5 Cans of beer per week   Comment: five, 40 oz beers daily     Social History   Substance and Sexual Activity  Drug Use Not Currently    Social History   Socioeconomic History  . Marital status: Single    Spouse name: Not on file  . Number of children: Not on file  . Years of education: Not on file  . Highest education level: Not on file  Occupational History  . Not on file  Tobacco Use  . Smoking status: Current Every Day Smoker    Packs/day: 1.00  . Smokeless tobacco: Never Used  Substance and Sexual Activity  . Alcohol use: Yes    Alcohol/week: 5.0 standard drinks    Types: 5 Cans of beer per week    Comment: five, 40 oz beers daily  . Drug use: Not Currently  . Sexual activity: Not on file  Other Topics Concern  . Not on file  Social History Narrative  . Not on file   Social Determinants of Health   Financial Resource Strain: Not on file  Food Insecurity: Not on file  Transportation Needs: Not on file  Physical Activity: Not on file  Stress: Not on file  Social Connections: Not on file   Additional Social History:    Pain Medications: see MAR Prescriptions: see MAR Over the Counter: see MAR History of alcohol / drug use?: Yes Longest period of sobriety (when/how long): unsure Negative Consequences of Use: Financial,Personal relationships Withdrawal Symptoms: Other (  Comment) (anxiety, depression) Name of Substance 1: alcohol 1 - Age of First Use: 25 yrs old 1 - Amount (size/oz): five 40 ozs daily 1 - Frequency: daily 1 - Duration: three yrs 1 - Last Use / Amount: Friday 1 - Method of Aquiring: buy 1- Route of Use: p.o.                  Sleep: Good  Appetite:  Good  Current Medications: Current Facility-Administered Medications  Medication Dose Route Frequency Provider Last Rate Last Admin  . acetaminophen (TYLENOL) tablet 650 mg  650 mg Oral Q6H PRN Estella Husk, MD   650 mg  at 08/17/20 2103  . alum & mag hydroxide-simeth (MAALOX/MYLANTA) 200-200-20 MG/5ML suspension 30 mL  30 mL Oral Q4H PRN Estella Husk, MD      . divalproex (DEPAKOTE ER) 24 hr tablet 750 mg  750 mg Oral Daily Pashayan, Mardelle Matte, MD      . hydrOXYzine (ATARAX/VISTARIL) tablet 25 mg  25 mg Oral TID PRN Estella Husk, MD   25 mg at 08/18/20 2109  . hydrOXYzine (ATARAX/VISTARIL) tablet 25 mg  25 mg Oral Q6H PRN Estella Husk, MD      . loperamide (IMODIUM) capsule 2-4 mg  2-4 mg Oral PRN Estella Husk, MD      . LORazepam (ATIVAN) tablet 1 mg  1 mg Oral Q6H PRN Estella Husk, MD      . magnesium hydroxide (MILK OF MAGNESIA) suspension 30 mL  30 mL Oral Daily PRN Estella Husk, MD      . multivitamin with minerals tablet 1 tablet  1 tablet Oral Daily Estella Husk, MD   1 tablet at 08/18/20 0756  . nicotine polacrilex (NICORETTE) gum 2 mg  2 mg Oral PRN Antonieta Pert, MD   2 mg at 08/17/20 1853  . ondansetron (ZOFRAN-ODT) disintegrating tablet 4 mg  4 mg Oral Q6H PRN Estella Husk, MD      . risperiDONE (RISPERDAL) tablet 2 mg  2 mg Oral Daily Estella Husk, MD   2 mg at 08/18/20 0756  . thiamine (B-1) injection 100 mg  100 mg Intramuscular Once Estella Husk, MD      . thiamine tablet 100 mg  100 mg Oral Daily Estella Husk, MD   100 mg at 08/18/20 0756  . traZODone (DESYREL) tablet 50 mg  50 mg Oral QHS PRN Estella Husk, MD   50 mg at 08/17/20 2103    Lab Results:  Results for orders placed or performed during the hospital encounter of 08/16/20 (from the past 48 hour(s))  POC SARS Coronavirus 2 Ag     Status: None   Collection Time: 08/17/20 11:54 AM  Result Value Ref Range   SARS Coronavirus 2 Ag NEGATIVE NEGATIVE    Comment: (NOTE) SARS-CoV-2 antigen NOT DETECTED.   Negative results are presumptive.  Negative results do not preclude SARS-CoV-2 infection and should not be used as the sole basis  for treatment or other patient management decisions, including infection  control decisions, particularly in the presence of clinical signs and  symptoms consistent with COVID-19, or in those who have been in contact with the virus.  Negative results must be combined with clinical observations, patient history, and epidemiological information. The expected result is Negative.  Fact Sheet for Patients: https://www.jennings-kim.com/  Fact Sheet for Healthcare Providers: https://alexander-rogers.biz/  This test is not yet approved or cleared by  the [employer] and  has been authorized for detection and/or diagnosis of SARS-CoV-2 by FDA under an Emergency Use Authorization (EUA).  This EUA will remain in effect (meaning this test can be used) for the duration of  the COV ID-19 declaration under Section 564(b)(1) of the Act, 21 U.S.C. section 360bbb-3(b)(1), unless the authorization is terminated or revoked sooner.      Blood Alcohol level:  Lab Results  Component Value Date   ETH 357 (HH) 08/14/2020   ETH 272 (H) 09/07/2019    Metabolic Disorder Labs: Lab Results  Component Value Date   HGBA1C 6.0 (H) 08/16/2020   MPG 125.5 08/16/2020   No results found for: PROLACTIN Lab Results  Component Value Date   CHOL 200 08/16/2020   TRIG 116 08/16/2020   HDL 92 08/16/2020   CHOLHDL 2.2 08/16/2020   VLDL 23 08/16/2020   LDLCALC 85 08/16/2020    Physical Findings: AIMS: Facial and Oral Movements Muscles of Facial Expression: None, normal Lips and Perioral Area: None, normal Jaw: None, normal Tongue: None, normal,Extremity Movements Upper (arms, wrists, hands, fingers): None, normal Lower (legs, knees, ankles, toes): None, normal, Trunk Movements Neck, shoulders, hips: None, normal, Overall Severity Severity of abnormal movements (highest score from questions above): None, normal Incapacitation due to abnormal movements: None, normal Patient's  awareness of abnormal movements (rate only patient's report): No Awareness, Dental Status Current problems with teeth and/or dentures?: No Does patient usually wear dentures?: No  CIWA:  CIWA-Ar Total: 0 COWS:  COWS Total Score: 1  Musculoskeletal: Strength & Muscle Tone: within normal limits Gait & Station: normal Patient leans: N/A  Psychiatric Specialty Exam: Physical Exam Vitals and nursing note reviewed.  Constitutional:      General: She is not in acute distress.    Appearance: Normal appearance. She is normal weight. She is not ill-appearing, toxic-appearing or diaphoretic.  HENT:     Head: Normocephalic and atraumatic.  Cardiovascular:     Rate and Rhythm: Normal rate.  Pulmonary:     Effort: Pulmonary effort is normal.  Musculoskeletal:        General: Normal range of motion.  Neurological:     General: No focal deficit present.     Mental Status: She is alert.     Review of Systems  Constitutional: Negative for fatigue and fever.  Respiratory: Negative for chest tightness and shortness of breath.   Cardiovascular: Negative for chest pain and palpitations.  Gastrointestinal: Negative for abdominal pain, constipation, diarrhea, nausea and vomiting.  Neurological: Negative for dizziness, weakness, light-headedness and headaches.  Psychiatric/Behavioral: Negative for suicidal ideas.    Blood pressure (!) 133/99, pulse 96, temperature 98.4 F (36.9 C), temperature source Oral, resp. rate 18, height 5\' 7"  (1.702 m), weight 81.6 kg, SpO2 100 %.Body mass index is 28.19 kg/m.  General Appearance: Casual  Eye Contact:  Fair  Speech:  Clear and Coherent and Normal Rate  Volume:  Normal  Mood:  "good"  Affect:  Appropriate  Thought Process:  Coherent  Orientation:  Full (Time, Place, and Person)  Thought Content:  Logical  Suicidal Thoughts:  No  Homicidal Thoughts:  No  Memory:  Immediate;   Good Recent;   Good  Judgement:  Poor  Insight:  Shallow  Psychomotor  Activity:  Normal  Concentration:  Concentration: Good and Attention Span: Good  Recall:  Good  Fund of Knowledge:  Good  Language:  Good  Akathisia:  Negative  Handed:  Right  AIMS (  if indicated):     Assets:  Physical Health Resilience  ADL's:  Intact  Cognition:  WNL  Sleep:  Number of Hours: 6.5     Treatment Plan Summary: Daily contact with patient to assess and evaluate symptoms and progress in treatment  Mr. Boettcher is a 25 year old transgender male (male to male) (he/his pronouns) who presents with depression and SI. PPHx is significant for Bipolar, Depression, ADHD, and 1 previous Hospitalization 2021.   He appears to be doing well and tolerating the increase in medications. He reports no side effects. Will plan for Depakote level to be drawn on 2/26 with a CBC and CMP. Will not make any changes to medications at this time. Encouraged him to attend group therapy. Will continue to monitor.    Bipolar Depressive Episode: -Continue Risperdal 2 mg daily -Continue Depakote ER 750 mg daily   Withdrawal: -Continue CIWA -Continue Ativan 1 mg q6 PRN   Nicotine Dependence: -Continue Nicorette gum 2 mg PRN    -Continue Multivitamin daily -Continue Thiamine 100 mg daily -Continue PRN's: Tylenol, Maalox, Atarax, Milk of Magnesia, Trazodone   Estimated Length of Stay: 3-5 days   Lauro Franklin, MD 08/19/2020, 7:13 AM

## 2020-08-20 MED ORDER — NICOTINE 21 MG/24HR TD PT24
21.0000 mg | MEDICATED_PATCH | Freq: Every day | TRANSDERMAL | Status: DC
  Administered 2020-08-20 – 2020-08-22 (×3): 21 mg via TRANSDERMAL
  Filled 2020-08-20 (×6): qty 1

## 2020-08-20 NOTE — BHH Group Notes (Signed)
LCSW Group Therapy Note  Type of Therapy/Topic: Group Therapy: Six Dimensions of Wellness  Participation Level: Active  Description of Group: Worksheet Packet  This group will address the concept of wellness and the six concepts of wellness: occupational, physical, social, intellectual, spiritual, and emotional. Patients will be encouraged to process areas in their lives that are out of balance and identify reasons for remaining unbalanced. Patients will be encouraged to explore ways to practice healthy habits daily to attain better physical and mental health outcomes.  Therapeutic Goals:  1. Identify aspects of wellness that they are doing well.  2. Identify aspects of wellness that they would like to improve upon.  3. Identify one action they can take to improve an aspect of wellness in their lives.  Pt accepted the worksheet packet that was provided for group.   Virjean Boman, LCSWA Clinicial Social Worker [facility] 

## 2020-08-20 NOTE — BHH Group Notes (Signed)
The focus of this group is to help patients establish daily goals to achieve during treatment and discuss how the patient can incorporate goal setting into their daily lives to aide in recovery.   Patient did not attend group. 

## 2020-08-20 NOTE — Progress Notes (Signed)
The patient expressed in group that he had a good day since he spent time with his peers and because he remained positive. His goal for tomorrow is to "enjoy the new day".

## 2020-08-20 NOTE — Progress Notes (Signed)
D:  Patient denied SI and HI, contracts for safety.  Denied A/V hallucinations.  Denied pain. A:  Medications administered per MD orders.  Emotional support and encouragement given patient. R:  Safety maintained with 15 minute checks.  

## 2020-08-20 NOTE — Progress Notes (Signed)
Patient denies SI/HI. He reported feeling better today and stated that his goal was to meet new people and change the negative thoughts in his head. He requested Trazodone for sleep. Trazodone was administered to the pt for sleep.   Medications reviewed with pt. Verbal support provided. 15 minute checks performed for safety.   Patient compliant with treatment plan.

## 2020-08-20 NOTE — Progress Notes (Signed)
[facility]  08/20/2020 6:53 AM Guy StarrRayquan L [name]  MRN:  [id number] Subjective:   [name] is a 25 year old transgender male (male to male) (he/his pronouns) who presents with depression and SI. PPHx is significant for Bipolar, Depression, ADHD, and 1 previous Hospitalization 2021.  He reports that he is tolerating the medication change well. He reports that he has been sleeping well and that his appetite is good. Discussed that he did sign a 72 hour letter but it would be best for him to stay until Saturday so I could get a Depakote level and he was agreeable to this. Encouraged him to attend groups and interact with other patients on the unit.  Principal Problem: Bipolar I disorder, most recent episode depressed (HCC) Diagnosis: Principal Problem:   Bipolar I disorder, most recent episode depressed (HCC) Active Problems:   Suicidal ideations   Substance induced mood disorder (HCC)  Total Time spent with patient: 20 minutes  I personally spent 20 minutes on the unit in direct patient care. The direct patient care time included face-to-face time with the patient, reviewing the patient's chart, communicating with other professionals, and coordinating care. Greater than 50% of this time was spent in counseling or coordinating care with the patient regarding goals of hospitalization, psycho-education, and discharge planning needs.   Past Psychiatric History: Bipolar, Depression, ADHD, and 1 previous Hospitalization 2021.  Past Medical History:  Past Medical History:  Diagnosis Date  . ADHD (attention deficit hyperactivity disorder)   . Bipolar 1 disorder (HCC)   . Depression   . Migraine   . PTSD (post-traumatic stress disorder)   . Sickle cell trait [facility](HCC)     Past Surgical History:  Procedure Laterality Date  . KNEE SURGERY    . WISDOM TOOTH EXTRACTION     Family History:  Family History  Problem Relation Age of Onset  . Sickle cell trait Father    Family  Psychiatric  History:  Brother-Suicide 2021 Social History:  Social History   Substance and Sexual Activity  Alcohol Use Yes  . Alcohol/week: 5.0 standard drinks  . Types: 5 Cans of beer per week   Comment: five, 40 oz beers daily     Social History   Substance and Sexual Activity  Drug Use Not Currently    Social History   Socioeconomic History  . Marital status: Single    Spouse name: Not on file  . Number of children: Not on file  . Years of education: Not on file  . Highest education level: Not on file  Occupational History  . Not on file  Tobacco Use  . Smoking status: Current Every Day Smoker    Packs/day: 1.00  . Smokeless tobacco: Never Used  Substance and Sexual Activity  . Alcohol use: Yes    Alcohol/week: 5.0 standard drinks    Types: 5 Cans of beer per week    Comment: five, 40 oz beers daily  . Drug use: Not Currently  . Sexual activity: Not on file  Other Topics Concern  . Not on file  Social History Narrative  . Not on file   Social Determinants of Health   Financial Resource Strain: Not on file  Food Insecurity: Not on file  Transportation Needs: Not on file  Physical Activity: Not on file  Stress: Not on file  Social Connections: Not on file   Additional Social History:    Pain Medications: see MAR Prescriptions: see MAR Over the Counter: see  MAR History of alcohol / drug use?: Yes Longest period of sobriety (when/how long): unsure Negative Consequences of Use: Financial,Personal relationships Withdrawal Symptoms: Other (Comment) (anxiety, depression) Name of Substance 1: alcohol 1 - Age of First Use: 25 yrs old 1 - Amount (size/oz): five 40 ozs daily 1 - Frequency: daily 1 - Duration: three yrs 1 - Last Use / Amount: Friday 1 - Method of Aquiring: buy 1- Route of Use: p.o.                  Sleep: Good  Appetite:  Good  Current Medications: Current Facility-Administered Medications  Medication Dose Route Frequency  Provider Last Rate Last Admin  . acetaminophen (TYLENOL) tablet 650 mg  650 mg Oral Q6H PRN Estella Husk, MD   650 mg at 08/17/20 2103  . alum & mag hydroxide-simeth (MAALOX/MYLANTA) 200-200-20 MG/5ML suspension 30 mL  30 mL Oral Q4H PRN Estella Husk, MD      . divalproex (DEPAKOTE ER) 24 hr tablet 750 mg  750 mg Oral Daily Lauro Franklin, MD   750 mg at 08/19/20 9735  . hydrOXYzine (ATARAX/VISTARIL) tablet 25 mg  25 mg Oral TID PRN Estella Husk, MD   25 mg at 08/18/20 2109  . hydrOXYzine (ATARAX/VISTARIL) tablet 25 mg  25 mg Oral Q6H PRN Estella Husk, MD      . loperamide (IMODIUM) capsule 2-4 mg  2-4 mg Oral PRN Estella Husk, MD      . LORazepam (ATIVAN) tablet 1 mg  1 mg Oral Q6H PRN Estella Husk, MD      . magnesium hydroxide (MILK OF MAGNESIA) suspension 30 mL  30 mL Oral Daily PRN Estella Husk, MD      . multivitamin with minerals tablet 1 tablet  1 tablet Oral Daily Estella Husk, MD   1 tablet at 08/19/20 3299  . nicotine polacrilex (NICORETTE) gum 2 mg  2 mg Oral PRN Antonieta Pert, MD   2 mg at 08/17/20 1853  . ondansetron (ZOFRAN-ODT) disintegrating tablet 4 mg  4 mg Oral Q6H PRN Estella Husk, MD      . risperiDONE (RISPERDAL) tablet 2 mg  2 mg Oral Daily Estella Husk, MD   2 mg at 08/19/20 2426  . thiamine (B-1) injection 100 mg  100 mg Intramuscular Once Estella Husk, MD      . thiamine tablet 100 mg  100 mg Oral Daily Estella Husk, MD   100 mg at 08/19/20 8341  . traZODone (DESYREL) tablet 50 mg  50 mg Oral QHS PRN Estella Husk, MD   50 mg at 08/19/20 2109    Lab Results: No results found for this or any previous visit (from the past 48 hour(s)).  Blood Alcohol level:  Lab Results  Component Value Date   ETH 357 (HH) 08/14/2020   ETH 272 (H) 09/07/2019    Metabolic Disorder Labs: Lab Results  Component Value Date   HGBA1C 6.0 (H) 08/16/2020   MPG 125.5  08/16/2020   No results found for: PROLACTIN Lab Results  Component Value Date   CHOL 200 08/16/2020   TRIG 116 08/16/2020   HDL 92 08/16/2020   CHOLHDL 2.2 08/16/2020   VLDL 23 08/16/2020   LDLCALC 85 08/16/2020    Physical Findings: AIMS: Facial and Oral Movements Muscles of Facial Expression: None, normal Lips and Perioral Area: None, normal Jaw: None, normal Tongue: None, normal,Extremity Movements Upper (  arms, wrists, hands, fingers): None, normal Lower (legs, knees, ankles, toes): None, normal, Trunk Movements Neck, shoulders, hips: None, normal, Overall Severity Severity of abnormal movements (highest score from questions above): None, normal Incapacitation due to abnormal movements: None, normal Patient's awareness of abnormal movements (rate only patient's report): No Awareness, Dental Status Current problems with teeth and/or dentures?: No Does patient usually wear dentures?: No  CIWA:  CIWA-Ar Total: 1 COWS:  COWS Total Score: 1  Musculoskeletal: Strength & Muscle Tone: within normal limits Gait & Station: normal Patient leans: N/A  Psychiatric Specialty Exam: Physical Exam Vitals and nursing note reviewed.  Constitutional:      General: She is not in acute distress.    Appearance: Normal appearance. She is normal weight. She is not ill-appearing, toxic-appearing or diaphoretic.  HENT:     Head: Normocephalic and atraumatic.  Cardiovascular:     Rate and Rhythm: Normal rate.  Pulmonary:     Effort: Pulmonary effort is normal.  Musculoskeletal:        General: Normal range of motion.  Neurological:     General: No focal deficit present.     Mental Status: She is alert.     Review of Systems  Constitutional: Negative for fatigue and fever.  Respiratory: Negative for chest tightness and shortness of breath.   Cardiovascular: Negative for chest pain and palpitations.  Gastrointestinal: Negative for abdominal pain, constipation, diarrhea, nausea and  vomiting.  Neurological: Negative for dizziness, weakness, light-headedness and headaches.  Psychiatric/Behavioral: Negative for suicidal ideas.    Blood pressure (!) 122/94, pulse (!) 130, temperature 97.8 F (36.6 C), temperature source Oral, resp. rate 18, height 5\' 7"  (1.702 m), weight 81.6 kg, SpO2 100 %.Body mass index is 28.19 kg/m.  General Appearance: Casual  Eye Contact:  Good  Speech:  Clear and Coherent and Normal Rate  Volume:  Normal  Mood:  Dysphoric  Affect:  Appropriate  Thought Process:  Coherent and Goal Directed  Orientation:  Full (Time, Place, and Person)  Thought Content:  Logical  Suicidal Thoughts:  No  Homicidal Thoughts:  No  Memory:  Immediate;   Fair Recent;   Fair  Judgement:  Fair  Insight:  Fair  Psychomotor Activity:  Normal  Concentration:  Concentration: Good and Attention Span: Good  Recall:  Good  Fund of Knowledge:  Good  Language:  Good  Akathisia:  Negative  Handed:  Right  AIMS (if indicated):     Assets:  Communication Skills Desire for Improvement Resilience  ADL's:  Intact  Cognition:  WNL  Sleep:  Number of Hours: 6.25     Treatment Plan Summary: Daily contact with patient to assess and evaluate symptoms and progress in treatment  Mr. Ardolino is a 25 year old transgender male (male to male) (he/his pronouns) who presents with depression and SI. PPHx is significant for Bipolar, Depression, ADHD, and 1 previous Hospitalization 2021.   He reports that he is tolerating the medications well. He is willing to stay until Saturday so that a Depakote level can be drawn and liver functions checked. Will not make any medication changes today. Will continue to monitor.   Bipolar Depressive Episode: -Continue Risperdal 2 mg daily -Continue Depakote ER 750 mg daily   Withdrawal: -CIWA for the last 24 hrs 0 -Continue CIWA -Continue Ativan 1 mg q6 PRN   Nicotine Dependence: -Continue Nicorette gum 2 mg PRN     -Continue Multivitamin daily -Continue Thiamine 100 mg daily -Continue PRN's: Tylenol, Maalox,  Atarax, Milk of Magnesia, Trazodone   Estimated Length of Stay: 2-3 days   Lauro Franklin, MD 08/20/2020, 6:53 AM

## 2020-08-20 NOTE — Plan of Care (Signed)
Nurse discussed coping skills with patient.

## 2020-08-21 NOTE — BHH Counselor (Signed)
CSW spoke with pt's mother, Raydan Schlabach [phone], who stated that pt could live with her in [place] at discharge. Ms. Millikan stated that she would buy him an [employer] ticket and contact CSW with the details.   Fredirick Lathe, LCSWA Clinicial Social Worker [employer]

## 2020-08-21 NOTE — BHH Counselor (Signed)
CSW left a HIPPA compliant message for Guy [name] [phone].  Fredirick Lathe, LCSWA Clinicial Social Worker [employer]

## 2020-08-21 NOTE — Progress Notes (Signed)
Adult Psychoeducational Group Note  Date:  08/21/2020 Time:  5:40 PM  Group Topic/Focus:  support systems  Participation Level:  Active  Participation Quality:  Appropriate  Affect:  Appropriate  Cognitive:  Appropriate  Insight: Appropriate  Engagement in Group:  Engaged  Modes of Intervention:  Discussion  Additional Comments:  Pt attended group and participated in discussion led by RN.  Dorris Pierre R Lisaanne Lawrie 08/21/2020, 5:40 PM

## 2020-08-21 NOTE — BHH Counselor (Signed)
Pt signed to resend his 72 hour.   Jadalynn Burr, LCSWA Clinicial Social Worker [facility] 

## 2020-08-21 NOTE — Progress Notes (Addendum)
Recreation Therapy Notes  Date: 2.25.22 Time: 0930 Location: [address] Dayroom  Group Topic: Stress Management  Goal Area(s) Addresses:  Patient will identify positive stress management techniques. Patient will identify benefits of using stress management post d/c.  Behavioral Response:  Engaged  Intervention: Stress Management  Activity:  Progressive Muscle Relaxation.  LRT read a script that lead the group in tensing and then relaxing each muscle group individually.  Patients were to follow as script was read to engage in move to gain as much looseness in the muscles as possible.    Education:  Stress Management, Discharge Planning.   Education Outcome: Acknowledges Education  Clinical Observations/Feedback: Pt attended and participated in group session.    Judea Fennimore, LRT/CTRS         Sriansh Farra A 08/21/2020 11:16 AM 

## 2020-08-21 NOTE — BHH Group Notes (Signed)
Type of Therapy and Topic: Group Therapy: Gratitude  Description of Group: The purpose behind this group is to get people thinking about things for which  they can be grateful. If continued over time, they might begin to spontaneously look for things and  situations for which to be grateful. Gratitude is related to a "wide variety of forms of wellbeing , whereas "negative attributions" can adversely affect relationships.  Several studies have shown that interventions to  increase gratitude can impact areas such as overall life satisfaction, decreased negative affect, increased happiness, the ability to provide emotional support to others, and decreased worrying.   Therapeutic Goals: 1. Patient will learn activities that focus on gratitude in their daily lives. 2. Patient will share gratitude in their daily lives. 3. Patient will learn to develop healthy habits and positive thinking techniques. 4. Patient will receive support and feedback from others  Pt received packet for group but slept throughout much of the group. Pt shared that they are grateful for life. Pt shared "I am grateful for who because I am helpful.".  Therapeutic Modalities: Cognitive Behavioral Therapy Solution Focused Therapy Motivational Interviewing

## 2020-08-21 NOTE — Progress Notes (Addendum)
Pt observed crying and yelling at mom and social work on phone during a 3 way call. Per pt "I'm not going, I'm grown and I know what I'm doing". Verbal altercation related to mom request for pt to return to [place]. Per pt "I have my food stamp coming next week, my husband gets out of jail on 11/05/20 and I can stay up here at least till next week. If I leave tomorrow I will not have food to eat on the train". Reports poor sleep last night related to racing thoughts "I just got a lot on my mind. My head was all over the place. Pt received PRN Tylenol and Vistaril for complain of headache 8/10 and anxiety 7/1 post phone altercation at 1327. Pt attended scheduled groups and was engaged in activities. Emotional support, reassurance and encouragement offered to pt throughout this shift. All medications given as ordered with verbal education and effects monitored. Q 15 minutes safety checks maintained without self harm gestures or outburst.  Pt tolerates meals and medications well without discomfort. Pt reports relief when reassessed post PRNs when reassessed at 1425. Remains safe on and off unit. Denies concerns at this time.

## 2020-08-21 NOTE — Progress Notes (Signed)
[facility] MD Progress Note  08/21/2020 7:05 AM [name]  MRN:  [id number] Subjective:   Guy [name] is a 25 year old transgender male (male to male) (he/his pronouns) who presents with depression and SI. PPHx is significant for Bipolar, Depression, ADHD, and 1 previous Hospitalization 2021.   He reports that he is doing well today. He reports that he slept not great but still ok last night. He reports that his appetite is good. He reports no SI, HI, or AVH. He reports that he is still ok staying until tomorrow so that labs can be drawn to ensure he is within the therapeutic window of Depakote and liver enzymes are good. Encouraged him to continue attending group therapy. He reports no other concerns at present. When asked where he would be discharging to initial he said a friends in [place]. When asked if Social Work could contact this friend to go over a Water engineer he got upset and said he would just go to his mothers in [place] and she would get him a Nurse, mental health. He provided consent for Social Work to call his mother.    Principal Problem: Bipolar I disorder, most recent episode depressed (HCC) Diagnosis: Principal Problem:   Bipolar I disorder, most recent episode depressed (HCC) Active Problems:   Suicidal ideations   Substance induced mood disorder (HCC)  Total Time spent with patient: 20 minutes  I personally spent 20 minutes on the unit in direct patient care. The direct patient care time included face-to-face time with the patient, reviewing the patient's chart, communicating with other professionals, and coordinating care. Greater than 50% of this time was spent in counseling or coordinating care with the patient regarding goals of hospitalization, psycho-education, and discharge planning needs.   Past Psychiatric History: Bipolar, Depression, ADHD, and 1 previous Hospitalization 2021.  Past Medical History:  Past Medical History:  Diagnosis Date  . ADHD (attention  deficit hyperactivity disorder)   . Bipolar 1 disorder (HCC)   . Depression   . Migraine   . PTSD (post-traumatic stress disorder)   . Sickle cell trait [facility])     Past Surgical History:  Procedure Laterality Date  . KNEE SURGERY    . WISDOM TOOTH EXTRACTION     Family History:  Family History  Problem Relation Age of Onset  . Sickle cell trait Father    Family Psychiatric  History: Brother-Suicide 2021 Social History:  Social History   Substance and Sexual Activity  Alcohol Use Yes  . Alcohol/week: 5.0 standard drinks  . Types: 5 Cans of beer per week   Comment: five, 40 oz beers daily     Social History   Substance and Sexual Activity  Drug Use Not Currently    Social History   Socioeconomic History  . Marital status: Single    Spouse name: Not on file  . Number of children: Not on file  . Years of education: Not on file  . Highest education level: Not on file  Occupational History  . Not on file  Tobacco Use  . Smoking status: Current Every Day Smoker    Packs/day: 1.00  . Smokeless tobacco: Never Used  Substance and Sexual Activity  . Alcohol use: Yes    Alcohol/week: 5.0 standard drinks    Types: 5 Cans of beer per week    Comment: five, 40 oz beers daily  . Drug use: Not Currently  . Sexual activity: Not on file  Other Topics Concern  . Not  on file  Social History Narrative  . Not on file   Social Determinants of Health   Financial Resource Strain: Not on file  Food Insecurity: Not on file  Transportation Needs: Not on file  Physical Activity: Not on file  Stress: Not on file  Social Connections: Not on file   Additional Social History:    Pain Medications: see MAR Prescriptions: see MAR Over the Counter: see MAR History of alcohol / drug use?: Yes Longest period of sobriety (when/how long): unsure Negative Consequences of Use: Financial,Personal relationships Withdrawal Symptoms: Other (Comment) (anxiety, depression) Name of Substance  1: alcohol 1 - Age of First Use: 25 yrs old 1 - Amount (size/oz): five 40 ozs daily 1 - Frequency: daily 1 - Duration: three yrs 1 - Last Use / Amount: Friday 1 - Method of Aquiring: buy 1- Route of Use: p.o.                  Sleep: Fair  Appetite:  Good  Current Medications: Current Facility-Administered Medications  Medication Dose Route Frequency Provider Last Rate Last Admin  . acetaminophen (TYLENOL) tablet 650 mg  650 mg Oral Q6H PRN Estella Husk, MD   650 mg at 08/17/20 2103  . alum & mag hydroxide-simeth (MAALOX/MYLANTA) 200-200-20 MG/5ML suspension 30 mL  30 mL Oral Q4H PRN Estella Husk, MD      . divalproex (DEPAKOTE ER) 24 hr tablet 750 mg  750 mg Oral Daily Lauro Franklin, MD   750 mg at 08/20/20 0759  . hydrOXYzine (ATARAX/VISTARIL) tablet 25 mg  25 mg Oral TID PRN Estella Husk, MD   25 mg at 08/18/20 2109  . magnesium hydroxide (MILK OF MAGNESIA) suspension 30 mL  30 mL Oral Daily PRN Estella Husk, MD      . multivitamin with minerals tablet 1 tablet  1 tablet Oral Daily Estella Husk, MD   1 tablet at 08/20/20 0759  . nicotine (NICODERM CQ - dosed in mg/24 hours) patch 21 mg  21 mg Transdermal Daily Antonieta Pert, MD   21 mg at 08/20/20 0900  . risperiDONE (RISPERDAL) tablet 2 mg  2 mg Oral Daily Estella Husk, MD   2 mg at 08/20/20 0759  . thiamine (B-1) injection 100 mg  100 mg Intramuscular Once Estella Husk, MD      . thiamine tablet 100 mg  100 mg Oral Daily Estella Husk, MD   100 mg at 08/20/20 0759  . traZODone (DESYREL) tablet 50 mg  50 mg Oral QHS PRN Estella Husk, MD   50 mg at 08/20/20 2125    Lab Results: No results found for this or any previous visit (from the past 48 hour(s)).  Blood Alcohol level:  Lab Results  Component Value Date   ETH 357 (HH) 08/14/2020   ETH 272 (H) 09/07/2019    Metabolic Disorder Labs: Lab Results  Component Value Date   HGBA1C 6.0  (H) 08/16/2020   MPG 125.5 08/16/2020   No results found for: PROLACTIN Lab Results  Component Value Date   CHOL 200 08/16/2020   TRIG 116 08/16/2020   HDL 92 08/16/2020   CHOLHDL 2.2 08/16/2020   VLDL 23 08/16/2020   LDLCALC 85 08/16/2020    Physical Findings: AIMS: Facial and Oral Movements Muscles of Facial Expression: None, normal Lips and Perioral Area: None, normal Jaw: None, normal Tongue: None, normal,Extremity Movements Upper (arms, wrists, hands, fingers): None, normal  Lower (legs, knees, ankles, toes): None, normal, Trunk Movements Neck, shoulders, hips: None, normal, Overall Severity Severity of abnormal movements (highest score from questions above): None, normal Incapacitation due to abnormal movements: None, normal Patient's awareness of abnormal movements (rate only patient's report): No Awareness, Dental Status Current problems with teeth and/or dentures?: No Does patient usually wear dentures?: No  CIWA:  CIWA-Ar Total: 0 COWS:  COWS Total Score: 1  Musculoskeletal: Strength & Muscle Tone: within normal limits Gait & Station: normal Patient leans: N/A  Psychiatric Specialty Exam: Physical Exam Vitals and nursing note reviewed.  Constitutional:      Appearance: Normal appearance. She is normal weight.  HENT:     Head: Normocephalic and atraumatic.  Cardiovascular:     Rate and Rhythm: Normal rate.  Pulmonary:     Effort: Pulmonary effort is normal.  Musculoskeletal:        General: Normal range of motion.  Neurological:     General: No focal deficit present.     Review of Systems  Constitutional: Negative for fatigue and fever.  Respiratory: Negative for chest tightness and shortness of breath.   Cardiovascular: Negative for chest pain and palpitations.  Gastrointestinal: Negative for abdominal pain, constipation, diarrhea and nausea.  Neurological: Negative for dizziness, weakness, light-headedness and headaches.  Psychiatric/Behavioral:  Negative for suicidal ideas.    Blood pressure 132/78, pulse 68, temperature 97.8 F (36.6 C), temperature source Oral, resp. rate 18, height 5\' 7"  (1.702 m), weight 81.6 kg, SpO2 100 %.Body mass index is 28.19 kg/m.  General Appearance: Fairly Groomed  Eye Contact:  Good  Speech:  Clear and Coherent and Normal Rate  Volume:  Normal  Mood:  good  Affect:  Appropriate  Thought Process:  Coherent and Goal Directed  Orientation:  Full (Time, Place, and Person)  Thought Content:  Logical  Suicidal Thoughts:  No  Homicidal Thoughts:  No  Memory:  Immediate;   Fair Recent;   Fair  Judgement:  Fair  Insight:  Fair  Psychomotor Activity:  Normal  Concentration:  Concentration: Good and Attention Span: Good  Recall:  Good  Fund of Knowledge:  Good  Language:  Good  Akathisia:  Negative  Handed:  Right  AIMS (if indicated):     Assets:  Communication Skills Desire for Improvement Physical Health Resilience  ADL's:  Intact  Cognition:  WNL  Sleep:  Number of Hours: 6.75     Treatment Plan Summary: Daily contact with patient to assess and evaluate symptoms and progress in treatment  Guy [name] is a 25 year old transgender male (male to male) (he/his pronouns) who presents with depression and SI. PPHx is significant for Bipolar, Depression, ADHD, and 1 previous Hospitalization 2021.   He is doing well on the medication increase. Will draw labs tomorrow AM as long as these are good will plan to discharge. Will get Depakote level, CBC, and CMP. No changes to medications will be made. Social Work spoke with his mother and she will provide a 2022 so he can go to Nurse, mental health. Will plan for discharge tomorrow. Will continue to monitor.    Bipolar Depressive Episode: -Continue Risperdal 2 mg daily -ContinueDepakote ER 750 mg daily   Withdrawal: -CIWA for the last 24 hrs 0 -Continue CIWA -Continue Ativan 1 mg q6 PRN   Nicotine Dependence: -Continue Nicorette gum 2  mg PRN    -Continue Multivitamin daily -Continue Thiamine 100 mg daily -Continue PRN's: Tylenol, Maalox, Atarax, Milk of Magnesia, Trazodone  Estimated Length of Stay: 1-2 days   Lauro FranklinAlexander S Pashayan, MD 08/21/2020, 7:05 AM

## 2020-08-21 NOTE — BHH Suicide Risk Assessment (Signed)
[facility] Discharge Suicide Risk Assessment   Principal Problem: Bipolar I disorder, most recent episode depressed (HCC) Discharge Diagnoses: Principal Problem:   Bipolar I disorder, most recent episode depressed (HCC) Active Problems:   Suicidal ideations   Substance induced mood disorder (HCC)   Total Time spent with patient: 30 minutes  Musculoskeletal: Strength & Muscle Tone: within normal limits Gait & Station: normal Patient leans: Backward  Psychiatric Specialty Exam: Review of Systems  Blood pressure 131/80, pulse 85, temperature 97.8 F (36.6 C), temperature source Oral, resp. rate 18, height 5\' 7"  (1.702 m), weight 81.6 kg, SpO2 100 %.Body mass index is 28.19 kg/m.  General Appearance: Casual  Eye Contact::  Fair  Speech:  Clear and Coherent409  Volume:  Normal  Mood:  Euthymic  Affect:  Appropriate  Thought Process:  Coherent  Orientation:  Full (Time, Place, and Person)  Thought Content:  Logical  Suicidal Thoughts:  No  Homicidal Thoughts:  No  Memory:  Recent;   Fair  Judgement:  Intact  Insight:  Fair  Psychomotor Activity:  Normal  Concentration:  Fair  Recall:  [phone] of Knowledge:Fair  Language: Fair  Akathisia:  No  Handed:  Right  AIMS (if indicated):     Assets:  Communication Skills Desire for Improvement Housing Leisure Time Physical Health Resilience  Sleep:  Number of Hours: 6.75  Cognition: WNL  ADL's:  Intact   Mental Status Per Nursing Assessment::   On Admission:  Suicidal ideation indicated by patient,Self-harm thoughts  Demographic Factors:  Male, Gay, lesbian, or bisexual orientation, Low socioeconomic status and Access to firearms  Loss Factors: Financial problems/change in socioeconomic status  Historical Factors: Impulsivity  Risk Reduction Factors:   Sense of responsibility to family, Religious beliefs about death, Living with another person, especially a relative and Positive social support  Continued Clinical  Symptoms:  Previous Psychiatric Diagnoses and Treatments  Cognitive Features That Contribute To Risk:  Closed-mindedness    Suicide Risk:  Mild:  Suicidal ideation of limited frequency, intensity, duration, and specificity.  There are no identifiable plans, no associated intent, mild dysphoria and related symptoms, good self-control (both objective and subjective assessment), few other risk factors, and identifiable protective factors, including available and accessible social support.   Follow-up Information    [facility]. Go on 08/27/2020.   Specialty: Behavioral Health Why: You have an appointment for substance abuse intensive outpatient therapy services on  08/27/20 at 10 am.  You also have a walk in appointment for medication management on 09/09/20 at 7:45 am.  Walk-ins are first come, first served and are held in person.  Contact information: [address] [place] [phone]              Plan Of Care/Follow-up recommendations:  Other:  Follow-up with outpatient care  [phone], MD 08/21/2020, 3:12 PM

## 2020-08-21 NOTE — Progress Notes (Signed)
Adult Psychoeducational Group Note  Date:  08/21/2020 Time:  9:24 AM  Group Topic/Focus:  Goals Group:   The focus of this group is to help patients establish daily goals to achieve during treatment and discuss how the patient can incorporate goal setting into their daily lives to aide in recovery.  Participation Level:  Active  Participation Quality:  Appropriate  Affect:  Appropriate  Cognitive:  Alert  Insight: Appropriate  Engagement in Group:  Engaged  Modes of Intervention:  Discussion  Additional Comments:  Pt attended group and participated in discussion.  Joshuan Bolander R Keira Bohlin 08/21/2020, 9:24 AM

## 2020-08-22 LAB — COMPREHENSIVE METABOLIC PANEL
ALT: 90 U/L — ABNORMAL HIGH (ref 0–44)
AST: 73 U/L — ABNORMAL HIGH (ref 15–41)
Albumin: 4.3 g/dL (ref 3.5–5.0)
Alkaline Phosphatase: 69 U/L (ref 38–126)
Anion gap: 11 (ref 5–15)
BUN: 11 mg/dL (ref 6–20)
CO2: 25 mmol/L (ref 22–32)
Calcium: 9.3 mg/dL (ref 8.9–10.3)
Chloride: 103 mmol/L (ref 98–111)
Creatinine, Ser: 0.98 mg/dL (ref 0.61–1.24)
GFR, Estimated: >60 mL/min (ref >60)
Glucose, Bld: 91 mg/dL (ref 70–99)
Potassium: 4.3 mmol/L (ref 3.5–5.1)
Sodium: 139 mmol/L (ref 135–145)
Total Bilirubin: 0.7 mg/dL (ref 0.3–1.2)
Total Protein: 7 g/dL (ref 6.5–8.1)

## 2020-08-22 LAB — CBC WITH DIFFERENTIAL/PLATELET
Abs Immature Granulocytes: 0.08 10*3/uL — ABNORMAL HIGH (ref 0.00–0.07)
Basophils Absolute: 0 10*3/uL (ref 0.0–0.1)
Basophils Relative: 1 %
Eosinophils Absolute: 0.1 10*3/uL (ref 0.0–0.5)
Eosinophils Relative: 2 %
HCT: 46.9 % (ref 39.0–52.0)
Hemoglobin: 15.3 g/dL (ref 13.0–17.0)
Immature Granulocytes: 1 %
Lymphocytes Relative: 16 %
Lymphs Abs: 1.4 10*3/uL (ref 0.7–4.0)
MCH: 28.5 pg (ref 26.0–34.0)
MCHC: 32.6 g/dL (ref 30.0–36.0)
MCV: 87.5 fL (ref 80.0–100.0)
Monocytes Absolute: 1 10*3/uL (ref 0.1–1.0)
Monocytes Relative: 12 %
Neutro Abs: 6.2 10*3/uL (ref 1.7–7.7)
Neutrophils Relative %: 68 %
Platelets: 316 10*3/uL (ref 150–400)
RBC: 5.36 MIL/uL (ref 4.22–5.81)
RDW: 13.7 % (ref 11.5–15.5)
WBC: 8.9 10*3/uL (ref 4.0–10.5)
nRBC: 0 % (ref 0.0–0.2)

## 2020-08-22 LAB — VALPROIC ACID LEVEL: Valproic Acid Lvl: 45 ug/mL — ABNORMAL LOW (ref 50.0–100.0)

## 2020-08-22 MED ORDER — DIVALPROEX SODIUM ER 250 MG PO TB24: 750.0000 mg | ORAL_TABLET | Freq: Every day | ORAL | 0 refills | Status: AC

## 2020-08-22 MED ORDER — RISPERIDONE 2 MG PO TABS: 2.0000 mg | ORAL_TABLET | Freq: Every day | ORAL | 0 refills | Status: AC

## 2020-08-22 NOTE — Progress Notes (Signed)
DAR NOTE: Patient presents with calm affect and pleasant mood.  Denies pain, auditory and visual hallucinations.  Maintained on routine safety checks.  Medications given as prescribed.  Support and encouragement offered as needed.  Will continue to monitor.

## 2020-08-22 NOTE — Progress Notes (Signed)
Patient denies SI/HI. He received both written and verbal discharge instructions. He verbalized understanding of discharge instructions. He received an AVS, SRA, transitional record, sample medications and prescriptions. All belongings from the locker were returned to the pt. The patient was safely discharged to the lobby and was provided with a taxi voucher for transportation to the train station.

## 2020-08-22 NOTE — BHH Group Notes (Signed)
LCSW Group Therapy Note  08/22/2020 11:28 AM  Type of Therapy and Topic:  Group Therapy:  Feelings around Relapse and Recovery  Participation Level:  Minimal   Description of Group:    Patients in this group will discuss emotions they experience before and after a relapse. They will process how experiencing these feelings, or avoidance of experiencing them, relates to having a relapse. Facilitator will guide patients to explore emotions they have related to recovery. Patients will be encouraged to process which emotions are more powerful. They will be guided to discuss the emotional reaction significant others in their lives may have to their relapse or recovery. Patients will be assisted in exploring ways to respond to the emotions of others without this contributing to a relapse.  Therapeutic Goals: 1. Patient will identify two or more emotions that lead to a relapse for them 2. Patient will identify two emotions that result when they relapse 3. Patient will identify two emotions related to recovery 4. Patient will demonstrate ability to communicate their needs through discussion and/or role plays   Summary of Patient Progress: Patient was present for the entirety of the group session. Patient shared during ice breaker introduction. Otherwise, he did not engage in conversation with other group members. Patient presented as alert and reserved.     Therapeutic Modalities:   Cognitive Behavioral Therapy Solution-Focused Therapy Assertiveness Training Relapse Prevention Therapy   Gwenevere Ghazi, MSW, [place], [place] 08/22/2020 11:28 AM

## 2020-08-22 NOTE — Progress Notes (Signed)
  [facility] Adult Case Management Discharge Plan :  Will you be returning to the same living situation after discharge:  No. At discharge, do you have transportation home?: Yes,  Patient will be provided transportation to [employer] in [place] in route to [place], [place]. Do you have the ability to pay for your medications: No.  Release of information consent forms completed and in the chart;  Patient's signature needed at discharge.  Patient to Follow up at:  Follow-up Information    [facility]. Go on 08/27/2020.   Specialty: Behavioral Health Why: You have an appointment for substance abuse intensive outpatient therapy services on  08/27/20 at 10 am.  You also have a walk in appointment for medication management on 09/09/20 at 7:45 am.  Walk-ins are first come, first served and are held in person.  Contact information: [address] [place] [address] [phone]              Next level of care provider has access to [facility] Link:no  Safety Planning and Suicide Prevention discussed: Yes,  SPE completed with patient; patient refused to provide consent for CSW to contact collateral.  Have you used any form of tobacco in the last 30 days? (Cigarettes, Smokeless Tobacco, Cigars, and/or Pipes): Yes  Has patient been referred to the Quitline?: Patient refused referral  Patient has been referred for addiction treatment: N/A  Corky Crafts, LCSWA 08/22/2020, 9:10 AM

## 2020-08-22 NOTE — Discharge Summary (Signed)
Physician Discharge Summary Note  Patient:  Guy [name] is an 25 y.o., adult MRN:  [id number] DOB:  1995/08/11 Patient phone:  [phone] (home)  Patient address:   [address] [place] [address],  Total Time spent with patient: 20 minutes  I personally spent 20 minutes on the unit in direct patient care. The direct patient care time included face-to-face time with the patient, reviewing the patient's chart, communicating with other professionals, and coordinating care. Greater than 50% of this time was spent in counseling or coordinating care with the patient regarding goals of hospitalization, psycho-education, and discharge planning needs.   Date of Admission:  08/17/2020 Date of Discharge: 08/22/2020  Reason for Admission:  Per H&P- "Guy [name] is a 25 year old transgender male (male to male) (he/his pronouns) who presents with depression and SI. PPHx is significant for Bipolar, Depression, ADHD, and 1 previous Hospitalization 2021.   He reports that his issues have been caused by multiple issues. He reports that his depression started when his brother killed himself last August (2021). He states that he also has has had not had the support of his mother because she moved to [place] a while ago. He reports that his boyfriend is currently in prison because he violated his parole. He reports that he is also homeless. He states all of things have made him think of killing himself for a few months now. The thoughts have been continuous.   He reports he was admitted to an inpatient unit in [facility] last year and that he then attempted to go to Center but was unable to because he did not have medicaid. He reports that he was placed on Bipolar, Depression, and ADHD. He reports that he has been depressed, had poor sleep and appetite, feelings of worthlessness/hopelessness/and guilt. He reports that he had a Manic episode about 2 weeks ago and that it last for about 4  hours. He reports racing thoughts, impulsivity, and high energy. He reports no AVH or anxiety symptoms. He reports that he does not have SI currently and that he has thought about it and no longer wants to hurt himself. He reports he has never attempted suicide and that he has never done any self injurious behaviors. He reports no history of abuse.   He reports that he has been drinking more than he should since around 2018. He reports drinking 2 40's a day. He reports smoking about 1 pack of cigarettes a day. He reports no illicit drug use. He reports that he is homeless. Reports that he currently works for a [employer] Reports he does have some friends in [place]. Reports he plans to stay with one of his friends after being discharged for a while and then plans to go to [place] to live with his mother.     Per [facility] Discharge Summary- "Patient interviewed this AM, he is calm and cooperative and sad appearing and makes minimal eye contact. Pt reports continued SI with a plan to kill himself with a knife. Pt states that he has been dealing with numerous stressors, including his brother committing suicide with the last year, homelessness for the past 2 months, and his boyfriend being currently incarcerated, as well as a less recent stressor of his mother moving back to [facility] (2019). Pt denies HI/AVH. Pt is unable to contract for safety. He reports almost daily alcohol use; states that at times he will become tremulous (not noted on exam this morning) and diaphoresis. He  denies alcohol withdrawal seizures. Pt informed of plan for psychiatric admission at this time; verbalized understanding.  Stay Summary: Guy [name] is a 25 year old male with a history of ADHD, bipolar disorder, PTSD, and depression [facility] for evaluation of depression and suicidal ideations due to stress.Pt was [facility]; [facility], patient had been  faxed out for inpatient psychiatric admission..Patient was found to have knife in his possession which was confiscated by Patent examinerlaw enforcement after he attempted to cut himself. Patient was noted to recently had ingested alcohol; BAC 357. Denies any other illicit substance use; UDS was negative. Patient continues to endorse suicidal ideations and increased depressive symptoms related to being homeless and losing a brother to suicide in the past year. Patient states his mother "left him" and [place] in 2019; states he has no contact with mother and no local supports. Patient states that he does not have any current outpatient services.   Patient reports history of "bipolar-depression" that was diagnosed in 2004. Patient states he has previously taken "Risperidal, Depakote, and just about everything else".   On assessment patient isrestricted and somewhatirritable; eye contact fleeting. Provider attempted to provide reassurance several times during assessment and assured patient that all services being offered were voluntary.Patient is currently reporting anhedonia, decreased sleep and appetite, depressive mood,increased mood swings,increased alcohol consumption, thoughts of hopelessness and worthlessness, intrusive thoughts of suicide, and feelings of guilt. Patient endorsing suicidal ideations with intent, plan unclear. He denies homicidal ideations, auditory or visual hallucinations, and does not appear to be responding to any external/internal ideations at this time.Patient was later apologetic expressing frustration with his life and feelings of hopelessness.Patient was kept overnight for stabilization and safety and was restarted on reported home medications. The following day, patient was unable to contract for safety, continued to report SI. [facility]   Principal Problem: Bipolar I disorder, most recent episode depressed [facility](HCC) Discharge Diagnoses: Principal Problem:    Bipolar I disorder, most recent episode depressed (HCC) Active Problems:   Suicidal ideations   Substance induced mood disorder (HCC)   Past Psychiatric History: Bipolar, Depression, ADHD, and 1 previous Hospitalization 2021.  Past Medical History:  Past Medical History:  Diagnosis Date  . ADHD (attention deficit hyperactivity disorder)   . Bipolar 1 disorder (HCC)   . Depression   . Migraine   . PTSD (post-traumatic stress disorder)   . Sickle cell trait [facility](HCC)     Past Surgical History:  Procedure Laterality Date  . KNEE SURGERY    . WISDOM TOOTH EXTRACTION     Family History:  Family History  Problem Relation Age of Onset  . Sickle cell trait Father    Family Psychiatric  History: Brother-Suicide 2021 Social History:  Social History   Substance and Sexual Activity  Alcohol Use Yes  . Alcohol/week: 5.0 standard drinks  . Types: 5 Cans of beer per week   Comment: five, 40 oz beers daily     Social History   Substance and Sexual Activity  Drug Use Not Currently    Social History   Socioeconomic History  . Marital status: Single    Spouse name: Not on file  . Number of children: Not on file  . Years of education: Not on file  . Highest education level: Not on file  Occupational History  . Not on file  Tobacco Use  . Smoking status: Current Every Day  Smoker    Packs/day: 1.00  . Smokeless tobacco: Never Used  Substance and Sexual Activity  . Alcohol use: Yes    Alcohol/week: 5.0 standard drinks    Types: 5 Cans of beer per week    Comment: five, 40 oz beers daily  . Drug use: Not Currently  . Sexual activity: Not on file  Other Topics Concern  . Not on file  Social History Narrative  . Not on file   Social Determinants of Health   Financial Resource Strain: Not on file  Food Insecurity: Not on file  Transportation Needs: Not on file  Physical Activity: Not on file  Stress: Not on file  Social Connections: Not on file    Hospital Course:   Patient presented to [facility] on 2/19 with depression and SI. On admission his Valproic acid level was <10 so the Depakote was titrated up and his Risperdal was continued. He tolerated the mediation changes well.   On day of discharge he reported that he was doing well. He reported that he slept well and that his appetite was good. He reported no SI, HI, or AVH. His Valproic acid came back as subtherapeutic at 45, however, his CMP revealed that his AST and ALT had increased. Given this I instructed him to make a follow up appointment with a PCP next week for redrawing of blood work. With this his medication can be further changed outpatient. He reported understanding. He was discharged to a Taxi to go to the train station so he could take a train to [place] to live with his mother.    Physical Findings: AIMS: Facial and Oral Movements Muscles of Facial Expression: None, normal Lips and Perioral Area: None, normal Jaw: None, normal Tongue: None, normal,Extremity Movements Upper (arms, wrists, hands, fingers): None, normal Lower (legs, knees, ankles, toes): None, normal, Trunk Movements Neck, shoulders, hips: None, normal, Overall Severity Severity of abnormal movements (highest score from questions above): None, normal Incapacitation due to abnormal movements: None, normal Patient's awareness of abnormal movements (rate only patient's report): No Awareness, Dental Status Current problems with teeth and/or dentures?: No Does patient usually wear dentures?: No  CIWA:  CIWA-Ar Total: 0 COWS:  COWS Total Score: 1  Musculoskeletal: Strength & Muscle Tone: within normal limits Gait & Station: normal Patient leans: N/A  Psychiatric Specialty Exam: Physical Exam Vitals and nursing note reviewed.  Constitutional:      Appearance: Normal appearance. She is normal weight.  HENT:     Head: Normocephalic and atraumatic.  Cardiovascular:     Rate and Rhythm: Normal rate.  Pulmonary:     Effort:  Pulmonary effort is normal.  Musculoskeletal:        General: Normal range of motion.  Neurological:     General: No focal deficit present.     Mental Status: She is alert.     Review of Systems  Constitutional: Negative for fatigue and fever.  Respiratory: Negative for chest tightness and shortness of breath.   Cardiovascular: Negative for chest pain and palpitations.  Gastrointestinal: Negative for abdominal pain, constipation, diarrhea, nausea and vomiting.  Neurological: Negative for dizziness, weakness, light-headedness and headaches.  Psychiatric/Behavioral: Negative for suicidal ideas.    Blood pressure 136/88, pulse (!) 106, temperature 98.1 F (36.7 C), temperature source Oral, resp. rate 20, height  (1.702 m), weight 81.6 kg, SpO2 100 %.Body mass index is 28.19 kg/m.  General Appearance: Fairly Groomed  Eye Contact:  Fair  Speech:  Clear and  Coherent and Normal Rate  Volume:  Normal  Mood:  good  Affect:  Appropriate  Thought Process:  Coherent  Orientation:  Full (Time, Place, and Person)  Thought Content:  Logical  Suicidal Thoughts:  No  Homicidal Thoughts:  No  Memory:  Immediate;   Fair Recent;   Fair  Judgement:  Fair  Insight:  Fair  Psychomotor Activity:  Normal  Concentration:  Concentration: Good and Attention Span: Good  Recall:  Good  Fund of Knowledge:  Good  Language:  Good  Akathisia:  Negative  Handed:  Right  AIMS (if indicated):     Assets:  Desire for Improvement Resilience  ADL's:  Intact  Cognition:  WNL  Sleep:  Number of Hours: 6.75     Have you used any form of tobacco in the last 30 days? (Cigarettes, Smokeless Tobacco, Cigars, and/or Pipes): Yes  Has this patient used any form of tobacco in the last 30 days? (Cigarettes, Smokeless Tobacco, Cigars, and/or Pipes) Yes, declined cessation prescriptions  Blood Alcohol level:  Lab Results  Component Value Date   ETH 357 (HH) 08/14/2020   ETH 272 (H) 09/07/2019    Metabolic  Disorder Labs:  Lab Results  Component Value Date   HGBA1C 6.0 (H) 08/16/2020   MPG 125.5 08/16/2020   No results found for: PROLACTIN Lab Results  Component Value Date   CHOL 200 08/16/2020   TRIG 116 08/16/2020   HDL 92 08/16/2020   CHOLHDL 2.2 08/16/2020   VLDL 23 08/16/2020   LDLCALC 85 08/16/2020    See Psychiatric Specialty Exam and Suicide Risk Assessment completed by Attending Physician prior to discharge.  Discharge destination:  Other:  [place] where mother is living  Is patient on multiple antipsychotic therapies at discharge:  No   Has Patient had three or more failed trials of antipsychotic monotherapy by history:  No  Recommended Plan for Multiple Antipsychotic Therapies: NA  Prescriptions given at discharge. Patient agreeable to plan. Given opportunity to ask questions. Appears to feel comfortable with discharge denies any current suicidal or homicidal thought.  Patient is also instructed prior to discharge to: Take all medications as prescribed by mental healthcare provider. Report any adverse effects and or reactions from the medicines to outpatient provider promptly. Patient has been instructed & cautioned: To not engage in alcohol and or illegal drug use while on prescription medicines. In the event of worsening symptoms,  patient is instructed to call the crisis hotline, 911 and or go to the nearest ED for appropriate evaluation and treatment of symptoms. To follow-up with primary care provider for other medical issues, concerns and or health care needs  The patient was evaluated each day by a clinical provider to ascertain response to treatment. Improvement was noted by the patient's report of decreasing symptoms, improved sleep and appetite, affect, medication tolerance, behavior, and participation in unit programming.  Patient was asked each day to complete a self inventory noting mood, mental status, pain, new symptoms, anxiety and concerns.  Patient responded  well to medication and being in a therapeutic and supportive environment. Positive and appropriate behavior was noted and the patient was motivated for recovery. The patient worked closely with the treatment team and case manager to develop a discharge plan with appropriate goals. Coping skills, problem solving as well as relaxation therapies were also part of the unit programming.  By the day of discharge patient was in much improved condition than upon admission.  Symptoms were reported as significantly  decreased or resolved completely. The patient denied SI/HI and voiced no AVH. The patient was motivated to continue taking medication with a goal of continued improvement in mental health.   Patient was discharged home with a plan to follow up as noted below.  Discharge Instructions    Diet - low sodium heart healthy   Complete by: As directed    Increase activity slowly   Complete by: As directed      Allergies as of 08/22/2020      Reactions   Shellfish Allergy Anaphylaxis      Medication List    TAKE these medications     Indication  divalproex 250 MG 24 hr tablet Commonly known as: DEPAKOTE ER Take 3 tablets (750 mg total) by mouth daily. Start taking on: August 23, 2020 What changed: how much to take  Indication: Bipolar Disorder   risperiDONE 2 MG tablet Commonly known as: RisperDAL Take 1 tablet (2 mg total) by mouth daily.  Indication: Bipolar Disorder       Follow-up Information    [facility]. Go on 08/27/2020.   Specialty: Behavioral Health Why: You have an appointment for substance abuse intensive outpatient therapy services on  08/27/20 at 10 am.  You also have a walk in appointment for medication management on 09/09/20 at 7:45 am.  Walk-ins are first come, first served and are held in person.  Contact information: [address] [place] [address] [phone]       [facility] Mental Health Unit Follow up.   Why:  Please call to inquire about mental health services for the uninsured.  Contact information: [address]  [place], [place] [address]  [phone]              Follow-up recommendations:  - Activity as tolerated. - Diet as recommended by PCP. - Keep all scheduled follow-up appointments as recommended.  Comments:  Patient is instructed to take all prescribed medications as recommended. Report any side effects or adverse reactions to your outpatient psychiatrist. Patient is instructed to abstain from alcohol and illegal drugs while on prescription medications. In the event of worsening symptoms, patient is instructed to call the crisis hotline, 911, or go to the nearest emergency department for evaluation and treatment.  Signed: Lauro Franklin, MD 08/22/2020, 3:25 PM

## 2020-08-22 NOTE — [facility] Counselor (Addendum)
CSW met with patient to discuss discharge. Patient reports his mother purchased an Tour manager from [place], [place] to [place], [place]. Patient provided written consent for staff to contact his mother, Makoto Sellitto @ [phone]. CSW attempted to contact patient's mother, she was not reached. CSW left HIPAA compliant voicemail for her to call back.   Signed:  Durenda Hurt, MSW, [place], LCASA 08/22/2020 9:17 AM    CSW was able to reach patient's mother who reported she got an [place] ticket for Monday. CSW informed her that the patient is being discharged. She reported that the patient's confirmation number is [id number].   Signed:  Durenda Hurt, MSW, [place], LCASA 08/22/2020 11:40 AM

## 2020-08-22 NOTE — [facility] Suicide Risk Assessment (Signed)
[facility] Discharge Suicide Risk Assessment   Principal Problem: Bipolar I disorder, most recent episode depressed (HCC) Discharge Diagnoses: Principal Problem:   Bipolar I disorder, most recent episode depressed (HCC) Active Problems:   Suicidal ideations   Substance induced mood disorder (HCC)   Total Time spent with patient: 20 minutes  Musculoskeletal: Strength & Muscle Tone: within normal limits Gait & Station: normal Patient leans: N/A  Psychiatric Specialty Exam: Review of Systems  All other systems reviewed and are negative.   Blood pressure (!) 128/92, pulse (!) 143, temperature 98.1 F (36.7 C), temperature source Oral, resp. rate 20, height 5\' 7"  (1.702 m), weight 81.6 kg, SpO2 100 %.Body mass index is 28.19 kg/m.  General Appearance: Casual  Eye Contact::  Fair  Speech:  Normal Rate409  Volume:  Normal  Mood:  Euthymic  Affect:  Congruent  Thought Process:  Coherent and Descriptions of Associations: Intact  Orientation:  Full (Time, Place, and Person)  Thought Content:  Logical  Suicidal Thoughts:  No  Homicidal Thoughts:  No  Memory:  Immediate;   Fair Recent;   Fair Remote;   Fair  Judgement:  Intact  Insight:  Fair  Psychomotor Activity:  Decreased  Concentration:  Fair  Recall:  [phone] of Knowledge:Fair  Language: Good  Akathisia:  Negative  Handed:  Right  AIMS (if indicated):     Assets:  Desire for Improvement Resilience  Sleep:  Number of Hours: 6.75  Cognition: WNL  ADL's:  Intact   Mental Status Per Nursing Assessment::   On Admission:  Suicidal ideation indicated by patient,Self-harm thoughts  Demographic Factors:  Male and Low socioeconomic status  Loss Factors: Financial problems/change in socioeconomic status  Historical Factors: Impulsivity  Risk Reduction Factors:   Positive coping skills or problem solving skills  Continued Clinical Symptoms:  Bipolar Disorder:   Mixed State  Cognitive Features That Contribute To  Risk:  None    Suicide Risk:  Minimal: No identifiable suicidal ideation.  Patients presenting with no risk factors but with morbid ruminations; may be classified as minimal risk based on the severity of the depressive symptoms   Follow-up Information    [facility]. Go on 08/27/2020.   Specialty: Behavioral Health Why: You have an appointment for substance abuse intensive outpatient therapy services on  08/27/20 at 10 am.  You also have a walk in appointment for medication management on 09/09/20 at 7:45 am.  Walk-ins are first come, first served and are held in person.  Contact information: [address] [place] [phone]              Plan Of Care/Follow-up recommendations:  Activity:  ad lib Tests:  Be sure to keep psychiatric appointments and make sure to have your depakote level, complete blood cell count and liver function enzymes checked because of taking depakote  [phone], MD 08/22/2020, 8:09 AM

## 2020-08-27 ENCOUNTER — Ambulatory Visit (HOSPITAL_COMMUNITY): Payer: Federal, State, Local not specified - Other | Admitting: Behavioral Health
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
